# Patient Record
Sex: Male | Born: 1945 | Race: White | Hispanic: No | Marital: Single | Smoking: Former smoker
Health system: Southern US, Community
[De-identification: ages and names within clinical notes are randomized; demographics above are authoritative.]

## PROBLEM LIST (undated history)

## (undated) DIAGNOSIS — E7800 Pure hypercholesterolemia, unspecified: Secondary | ICD-10-CM

## (undated) DIAGNOSIS — M1990 Unspecified osteoarthritis, unspecified site: Secondary | ICD-10-CM

## (undated) DIAGNOSIS — E876 Hypokalemia: Secondary | ICD-10-CM

## (undated) DIAGNOSIS — Z8619 Personal history of other infectious and parasitic diseases: Secondary | ICD-10-CM

## (undated) DIAGNOSIS — E538 Deficiency of other specified B group vitamins: Secondary | ICD-10-CM

## (undated) DIAGNOSIS — I10 Essential (primary) hypertension: Secondary | ICD-10-CM

## (undated) DIAGNOSIS — Z972 Presence of dental prosthetic device (complete) (partial): Secondary | ICD-10-CM

## (undated) DIAGNOSIS — D696 Thrombocytopenia, unspecified: Secondary | ICD-10-CM

## (undated) DIAGNOSIS — J45909 Unspecified asthma, uncomplicated: Secondary | ICD-10-CM

## (undated) DIAGNOSIS — E785 Hyperlipidemia, unspecified: Secondary | ICD-10-CM

## (undated) DIAGNOSIS — M5136 Other intervertebral disc degeneration, lumbar region: Secondary | ICD-10-CM

## (undated) DIAGNOSIS — I509 Heart failure, unspecified: Secondary | ICD-10-CM

## (undated) DIAGNOSIS — K219 Gastro-esophageal reflux disease without esophagitis: Secondary | ICD-10-CM

## (undated) DIAGNOSIS — E119 Type 2 diabetes mellitus without complications: Secondary | ICD-10-CM

## (undated) DIAGNOSIS — E269 Hyperaldosteronism, unspecified: Secondary | ICD-10-CM

## (undated) DIAGNOSIS — IMO0002 Reserved for concepts with insufficient information to code with codable children: Secondary | ICD-10-CM

## (undated) DIAGNOSIS — I491 Atrial premature depolarization: Secondary | ICD-10-CM

## (undated) DIAGNOSIS — J449 Chronic obstructive pulmonary disease, unspecified: Secondary | ICD-10-CM

## (undated) DIAGNOSIS — N189 Chronic kidney disease, unspecified: Secondary | ICD-10-CM

## (undated) DIAGNOSIS — R0600 Dyspnea, unspecified: Secondary | ICD-10-CM

## (undated) DIAGNOSIS — R918 Other nonspecific abnormal finding of lung field: Secondary | ICD-10-CM

## (undated) DIAGNOSIS — R569 Unspecified convulsions: Secondary | ICD-10-CM

## (undated) DIAGNOSIS — M51369 Other intervertebral disc degeneration, lumbar region without mention of lumbar back pain or lower extremity pain: Secondary | ICD-10-CM

## (undated) DIAGNOSIS — M5416 Radiculopathy, lumbar region: Secondary | ICD-10-CM

## (undated) HISTORY — PX: BRAIN TUMOR EXCISION: SHX577

## (undated) HISTORY — PX: OTHER SURGICAL HISTORY: SHX169

## (undated) HISTORY — PX: THORACOTOMY: SUR1349

---

## 2004-01-17 ENCOUNTER — Other Ambulatory Visit: Payer: Self-pay

## 2004-01-17 ENCOUNTER — Emergency Department: Payer: Self-pay | Admitting: Emergency Medicine

## 2004-01-21 ENCOUNTER — Inpatient Hospital Stay: Payer: Self-pay | Admitting: Internal Medicine

## 2004-01-31 ENCOUNTER — Ambulatory Visit: Payer: Self-pay | Admitting: Internal Medicine

## 2004-02-04 ENCOUNTER — Ambulatory Visit: Payer: Self-pay | Admitting: Internal Medicine

## 2004-02-11 ENCOUNTER — Ambulatory Visit: Payer: Self-pay | Admitting: Internal Medicine

## 2004-02-26 ENCOUNTER — Ambulatory Visit: Payer: Self-pay | Admitting: Internal Medicine

## 2005-05-24 ENCOUNTER — Ambulatory Visit: Payer: Self-pay | Admitting: Internal Medicine

## 2005-11-12 ENCOUNTER — Ambulatory Visit: Payer: Self-pay | Admitting: Gastroenterology

## 2006-07-29 IMAGING — CT CT CHEST W/ CM
1 of 2 series · 15 of 32 positions shown, 19 images · IV contrast (APPLIED)
Comparison: none

REASON FOR EXAM: diff breathing/pain rm14
COMMENTS:

[Series 6: inspace · axial · 0.73mm/px · z∈[-382,-60]mm · 15 of 507 slices shown, 19 images]
[im 24/507  mediastinal]
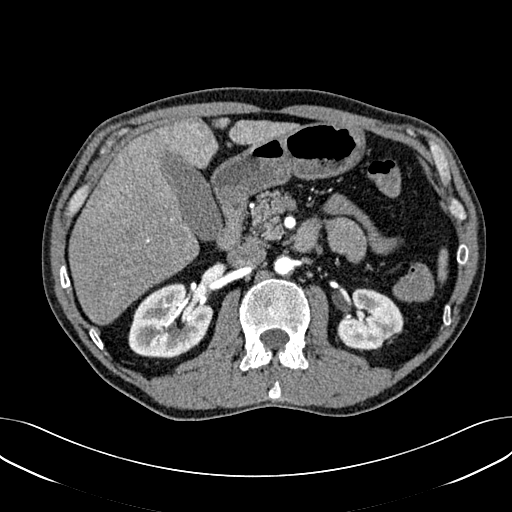
[im 24/507  lung]
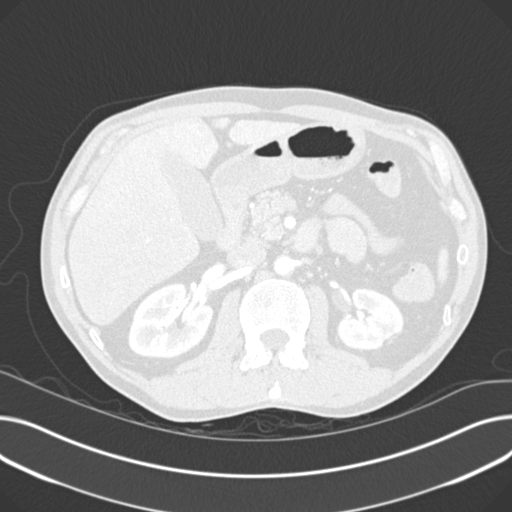
[im 70/507  lung]
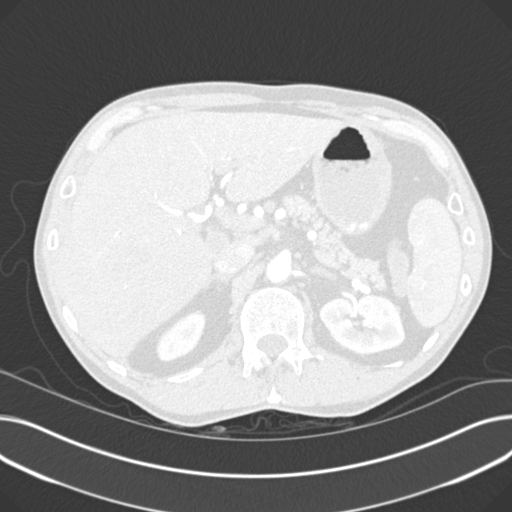
[im 116/507  lung]
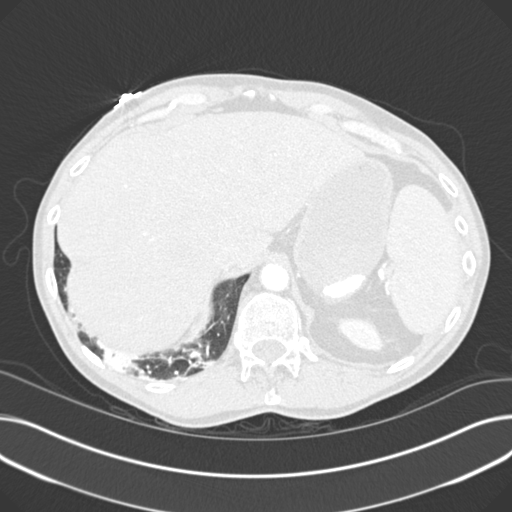
[im 127/507  lung]
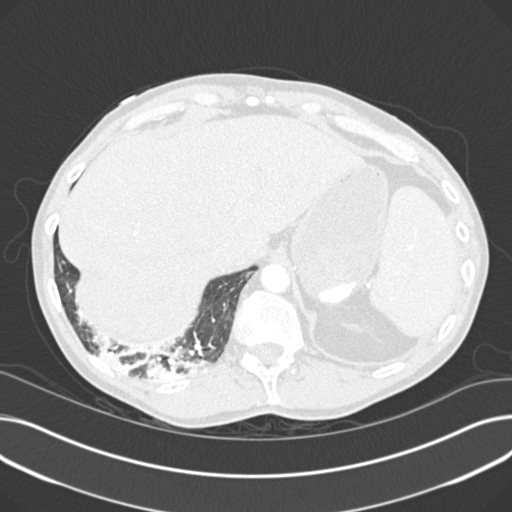
[im 162/507  mediastinal]
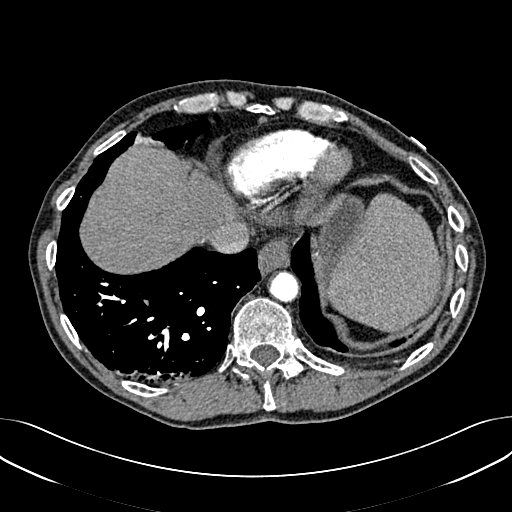
[im 162/507  lung]
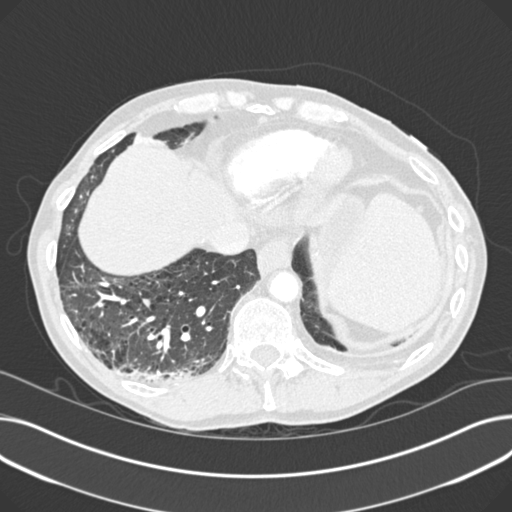
[im 185/507  lung]
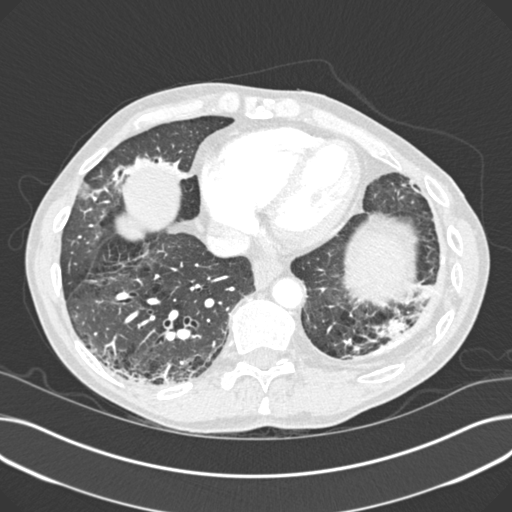
[im 231/507  lung]
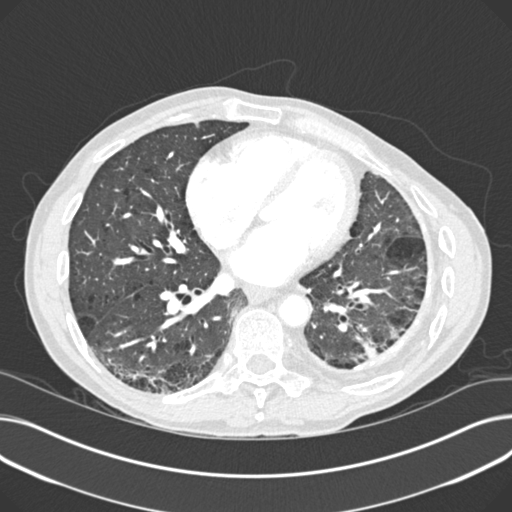
[im 254/507  lung]
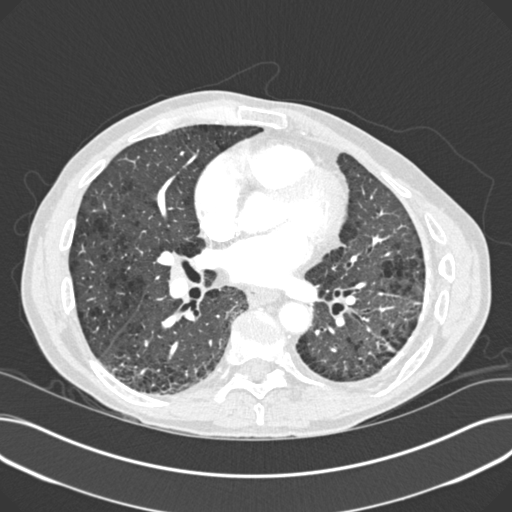
[im 277/507  mediastinal]
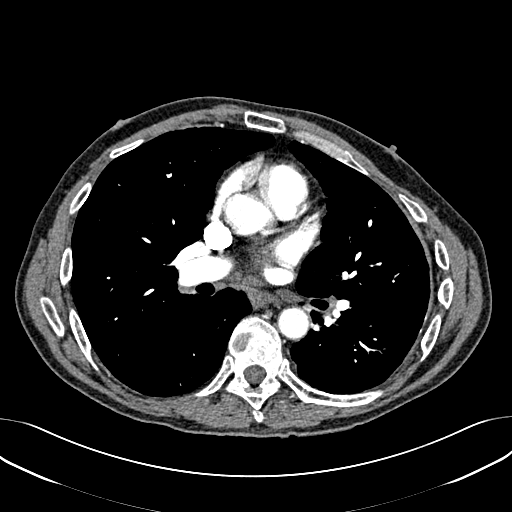
[im 277/507  lung]
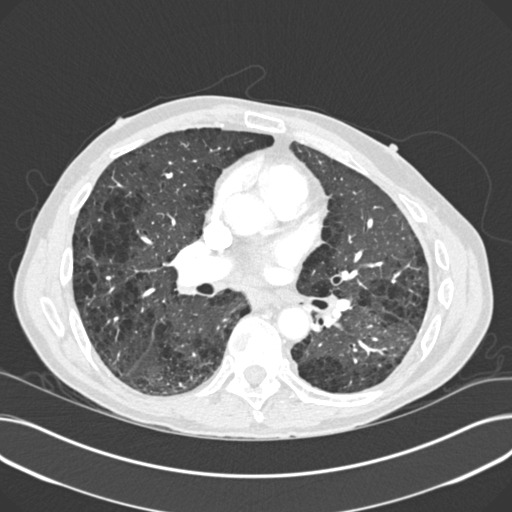
[im 323/507  lung]
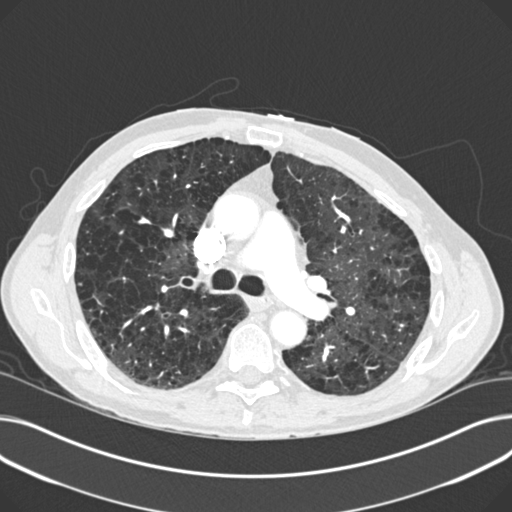
[im 346/507  lung]
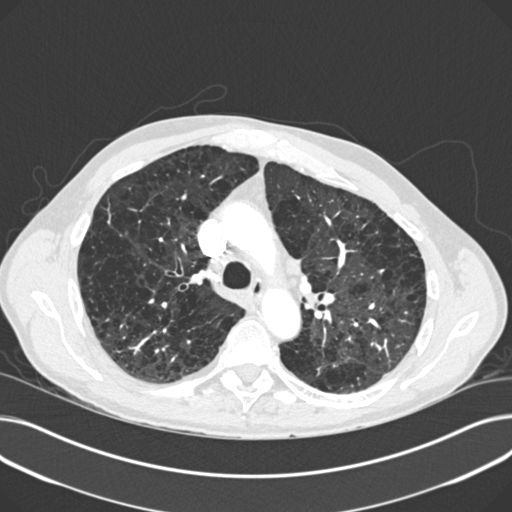
[im 380/507  lung]
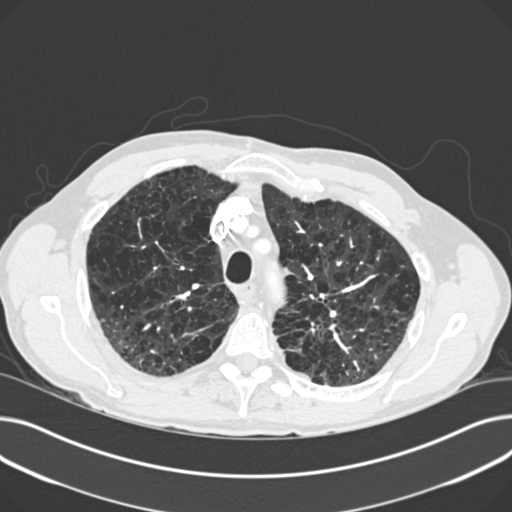
[im 415/507  mediastinal]
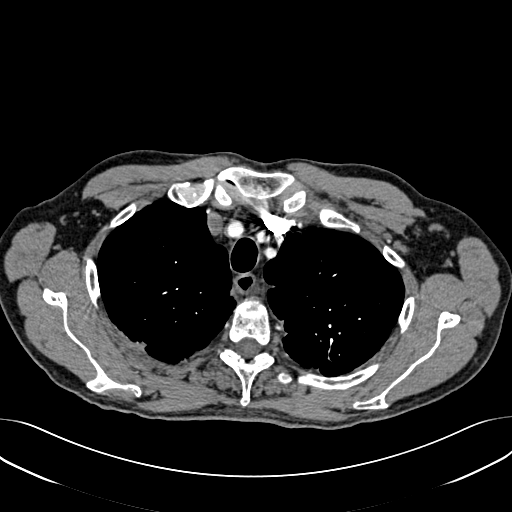
[im 415/507  lung]
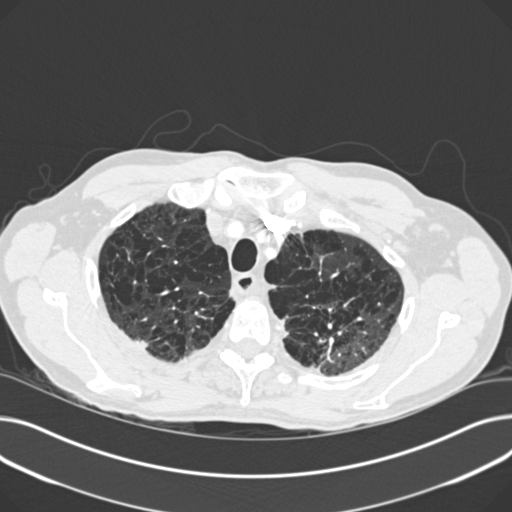
[im 438/507  lung]
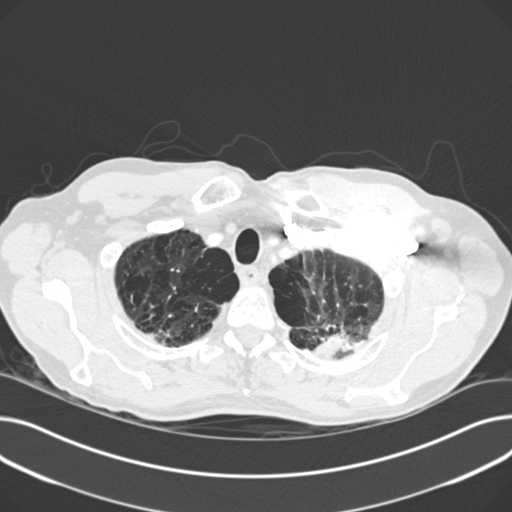
[im 484/507  lung]
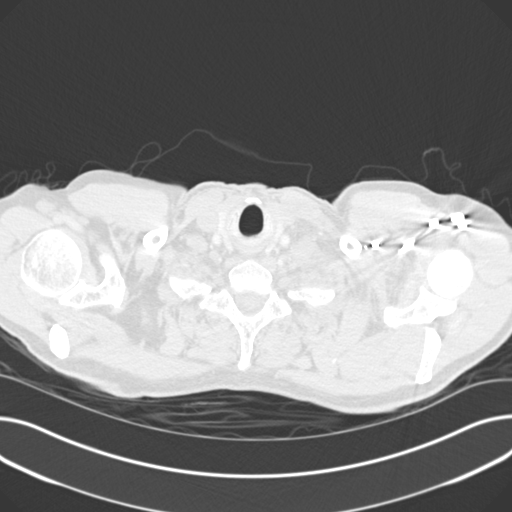

[15 of 32 positions shown; findings below may reference images not displayed]

PROCEDURE:     CT  - CT CHEST (FOR PE) W  - January 17, 2004  [DATE]

RESULT:     This study was initially interpreted by Dr. Palape of [REDACTED].  Preliminary report was relayed to Dr. Heller of the
[HOSPITAL] [DATE] p.m. on 01/17/04.

Spiral 3 mm sections were obtained from the thoracic inlet through the lung
bases status post intravenous administration of 100 cc of Isovue 370.

Evaluation of the mediastinum and hilar regions and structures demonstrates
small subcentimeter lymph nodes within the posterior prevascular space.  No
evidence of pathologic size adenopathy or masses within the mediastinum are
demonstrated.

There is no evidence of filling defect within the main lobar or segmental
pulmonary arteries to suggest sequela of pulmonary embolus.

A soft tissue density projects within the posterior LEFT lung apex along the
pleural surface.  This appears to contain a focal region of calcium.
Calcified pleural plaques are also demonstrated within the LEFT lung base.
There is an area of atelectasis vs. infiltrate or scarring along the
posterior aspect of the RIGHT lung base.  The lung parenchyma demonstrates
diffuse severe emphysematous changes.  The visualized upper abdominal
viscera demonstrate no gross abnormalities.
IMPRESSION: 1.     No evidence of pulmonary embolus.
2.     Severe emphysematous changes.
3.     Soft tissue density within the LEFT lung apex.  This may represent a
region of chronic scarring though an area of neoplastic disease cannot be
excluded without documentation of stability.  Follow up evaluation is
recommended and/or further evaluation with tissue sample if clinically
warranted.

## 2007-08-09 ENCOUNTER — Ambulatory Visit: Payer: Self-pay | Admitting: Internal Medicine

## 2011-03-27 ENCOUNTER — Emergency Department: Payer: Self-pay | Admitting: Emergency Medicine

## 2011-03-27 LAB — CBC WITH DIFFERENTIAL/PLATELET
Basophil #: 0 10*3/uL (ref 0.0–0.1)
Basophil %: 0.2 %
Eosinophil #: 0.2 10*3/uL (ref 0.0–0.7)
HCT: 48.6 % (ref 40.0–52.0)
HGB: 16.4 g/dL (ref 13.0–18.0)
Lymphocyte %: 14.3 %
MCH: 29.9 pg (ref 26.0–34.0)
MCV: 88 fL (ref 80–100)
Monocyte %: 10.4 %
Neutrophil #: 9.7 10*3/uL — ABNORMAL HIGH (ref 1.4–6.5)
Neutrophil %: 73.3 %
RBC: 5.49 10*6/uL (ref 4.40–5.90)
WBC: 13.2 10*3/uL — ABNORMAL HIGH (ref 3.8–10.6)

## 2011-03-27 LAB — COMPREHENSIVE METABOLIC PANEL
Albumin: 4.1 g/dL (ref 3.4–5.0)
Alkaline Phosphatase: 88 U/L (ref 50–136)
Anion Gap: 14 (ref 7–16)
Calcium, Total: 9.5 mg/dL (ref 8.5–10.1)
Co2: 31 mmol/L (ref 21–32)
EGFR (Non-African Amer.): >60
Glucose: 123 mg/dL — ABNORMAL HIGH (ref 65–99)
Potassium: 2.8 mmol/L — ABNORMAL LOW (ref 3.5–5.1)
SGOT(AST): 21 U/L (ref 15–37)
SGPT (ALT): 19 U/L
Total Protein: 8.8 g/dL — ABNORMAL HIGH (ref 6.4–8.2)

## 2011-03-27 LAB — TROPONIN I: Troponin-I: 0.04 ng/mL

## 2011-03-30 ENCOUNTER — Ambulatory Visit: Payer: Self-pay | Admitting: Oncology

## 2011-04-01 ENCOUNTER — Ambulatory Visit: Payer: Self-pay | Admitting: Oncology

## 2011-04-26 ENCOUNTER — Ambulatory Visit: Payer: Self-pay | Admitting: Oncology

## 2011-07-22 ENCOUNTER — Ambulatory Visit: Payer: Self-pay | Admitting: Cardiothoracic Surgery

## 2011-07-22 ENCOUNTER — Ambulatory Visit: Payer: Self-pay | Admitting: Oncology

## 2011-07-26 ENCOUNTER — Ambulatory Visit: Payer: Self-pay | Admitting: Oncology

## 2011-08-04 LAB — CBC CANCER CENTER
Eosinophil #: 0.3 x10 3/mm (ref 0.0–0.7)
Eosinophil %: 4.6 %
HCT: 46.5 % (ref 40.0–52.0)
HGB: 15.8 g/dL (ref 13.0–18.0)
Lymphocyte #: 2.3 x10 3/mm (ref 1.0–3.6)
Lymphocyte %: 39.4 %
MCH: 29.5 pg (ref 26.0–34.0)
MCHC: 34.1 g/dL (ref 32.0–36.0)
Monocyte #: 0.7 x10 3/mm (ref 0.2–1.0)
RBC: 5.37 10*6/uL (ref 4.40–5.90)

## 2011-08-04 LAB — CREATININE, SERUM: Creatinine: 1.38 mg/dL — ABNORMAL HIGH (ref 0.60–1.30)

## 2011-08-04 LAB — PROTIME-INR: INR: 0.9

## 2011-08-05 ENCOUNTER — Inpatient Hospital Stay: Payer: Self-pay

## 2011-08-06 LAB — CBC WITH DIFFERENTIAL/PLATELET
Basophil #: 0 10*3/uL (ref 0.0–0.1)
Eosinophil %: 0 %
HCT: 39.8 % — ABNORMAL LOW (ref 40.0–52.0)
HGB: 13.4 g/dL (ref 13.0–18.0)
Lymphocyte #: 0.8 10*3/uL — ABNORMAL LOW (ref 1.0–3.6)
MCH: 29.3 pg (ref 26.0–34.0)
MCHC: 33.7 g/dL (ref 32.0–36.0)
MCV: 87 fL (ref 80–100)
Monocyte #: 0.9 x10 3/mm (ref 0.2–1.0)
Neutrophil %: 88.1 %
Platelet: 135 10*3/uL — ABNORMAL LOW (ref 150–440)
RDW: 14.4 % (ref 11.5–14.5)

## 2011-08-06 LAB — BASIC METABOLIC PANEL
Anion Gap: 13 (ref 7–16)
BUN: 14 mg/dL (ref 7–18)
Chloride: 99 mmol/L (ref 98–107)
Co2: 28 mmol/L (ref 21–32)
Creatinine: 1.85 mg/dL — ABNORMAL HIGH (ref 0.60–1.30)
EGFR (Non-African Amer.): 37 — ABNORMAL LOW
Glucose: 203 mg/dL — ABNORMAL HIGH (ref 65–99)
Osmolality: 286 (ref 275–301)
Potassium: 2.7 mmol/L — ABNORMAL LOW (ref 3.5–5.1)

## 2011-08-10 LAB — PATHOLOGY REPORT

## 2011-08-26 ENCOUNTER — Ambulatory Visit: Payer: Self-pay | Admitting: Oncology

## 2011-09-21 LAB — BASIC METABOLIC PANEL
Anion Gap: 8 (ref 7–16)
BUN: 8 mg/dL (ref 7–18)
Chloride: 101 mmol/L (ref 98–107)
Co2: 31 mmol/L (ref 21–32)
Creatinine: 1.28 mg/dL (ref 0.60–1.30)
EGFR (Non-African Amer.): 58 — ABNORMAL LOW
Potassium: 3.2 mmol/L — ABNORMAL LOW (ref 3.5–5.1)
Sodium: 140 mmol/L (ref 136–145)

## 2011-09-26 ENCOUNTER — Ambulatory Visit: Payer: Self-pay | Admitting: Oncology

## 2011-10-26 ENCOUNTER — Ambulatory Visit: Payer: Self-pay | Admitting: Oncology

## 2011-11-26 ENCOUNTER — Ambulatory Visit: Payer: Self-pay | Admitting: Oncology

## 2012-05-02 ENCOUNTER — Ambulatory Visit: Payer: Self-pay | Admitting: Oncology

## 2012-05-04 ENCOUNTER — Ambulatory Visit: Payer: Self-pay | Admitting: Oncology

## 2012-05-25 ENCOUNTER — Ambulatory Visit: Payer: Self-pay | Admitting: Oncology

## 2013-03-19 ENCOUNTER — Ambulatory Visit: Payer: Self-pay | Admitting: Physical Medicine and Rehabilitation

## 2014-05-14 NOTE — Discharge Summary (Signed)
PATIENT NAME:  Glenn Welch, Glenn Welch MR#:  [id number] DATE OF BIRTH:  30-Nov-1945  DATE OF ADMISSION:  08/05/2011 DATE OF DISCHARGE:  08/12/2011  ADMITTING DIAGNOSIS: Iatrogenic right pneumothorax.   DISCHARGE DIAGNOSIS: Iatrogenic right pneumothorax.   OPERATION PERFORMED: Right thoracoscopy with retrieval of intrathoracic foreign body.   HOSPITAL COURSE: Mr. Joseth Weigel is a 69 year old gentleman with a known right upper lobe mass. He had been followed by serial CT scans and there was concern that this may represent a malignancy. He was scheduled for an outpatient CT-guided needle biopsy and during the procedure an iatrogenic pneumothorax occurred resulting in the need for chest tube insertion. During the procedure a metal cannula became dislodged in the patient's chest and he had to be taken to the Emergency Room where he underwent a right thoracoscopy for retrieval of this intrathoracic foreign body. The next day the patient was taken back to the CT suite where he underwent additional core biopsies of the right upper lobe mass. The final pathology on these was negative for malignancy. The patient had a postoperative air leak which was managed conservatively over the next few days and ultimately on 07/18 the chest tube was removed and a post chest tube removal chest x-ray showed no evidence of a pneumothorax. The patient was discharged to home. During the hospital course he did have difficulty with urination and required a Foley catheter for inability to void. He also required oxygen therapy intermittently but at the time of discharge his oxygen saturations were greater than 92% while ambulatory. He will be scheduled follow up with Dr. Genevive Bi in one week. He will have a chest x-ray made at that time.   DISCHARGE MEDICATIONS:  1. Percocet 5/325, 1 to 2 tablets as needed every 4 to 6 hours for pain.  2. Omeprazole 20 mg once a day. 3. Metoprolol 25 mg once a day. 4. Hydrochlorothiazide 25 mg once a day.   5. Potassium chloride 20 mEq 2 tablets a day. 6. Zocor 40 mg once a day. 7. Lisinopril 40 mg once a day. 8. Gaviscon as needed.   DISCHARGE INSTRUCTIONS: He was told to leave his dressings in place for 48 hours after which time he may remove them and shower.   ____________________________ Lew Dawes Genevive Bi, MD teo:cms D: 08/12/2011 11:34:24 ET T: 08/12/2011 15:03:03 ET JOB#: [id number]  cc: Christia Reading E. Genevive Bi, MD, <Dictator> Louis Matte MD ELECTRONICALLY SIGNED 08/18/2011 11:00

## 2014-05-19 NOTE — H&P (Signed)
PATIENT NAME:  Glenn Welch, Glenn Welch MR#:  [id number] DATE OF BIRTH:  13-Oct-1945  DATE OF ADMISSION:  08/05/2011  ADMITTING PHYSICIAN: Nestor Lewandowsky, MD  ADMITTING DIAGNOSIS: Retained foreign body, right chest.   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 Several years ago he underwent thoracotomy and resection and more recently he was identified as having a right upper lobe nodule. A CT scan was performed recently which showed a slight increase in the size of the right upper lobe nodule and he was offered a CT-guided needle biopsy as part of the work-up. This was performed today and unfortunately during the procedure the needle broke off in the chest cavity and the patient was taken to the operating room where he underwent a bronchoscopy and thoracoscopy with removal of the needle. There were no other untoward complications, there were no other injuries to the chest that we could identify, and the patient was then admitted to the hospital after the procedure for continued care.   PAST MEDICAL HISTORY:  1. Benign brain tumor removed approximately 15 years ago.  2. In 2008 he had a left thoracotomy and lung resection. At the time, it was not clear what this was for, but it may have been for a benign tumor. In addition, he underwent talc pleurodesis on that side.   SOCIAL HISTORY: He is divorced. He smoked for many years, but quit in 2005. He has a previous history of heavy alcohol consumption but none within the last several years.   FAMILY HISTORY: His mother is deceased from lung and breast cancer. His father is deceased from an aneurysm. He also has a history of diabetes in a brother.   REVIEW OF SYSTEMS: Unobtainable at this time as the patient is immediately postoperative.       PHYSICAL EXAMINATION:   GENERAL/VITALS: Examination revealed a thin, pleasant gentleman who had oxygen saturations of approximately 90%  on nasal cannula. His blood pressure was 120/80 with a heart rate of approximately 60.   NECK: Supple without thyromegaly or adenopathy.   RESPIRATORY: Lungs were actually clear bilaterally. They were somewhat diminished on the left. His surgical wounds were all intact.   CARDIOVASCULAR: Heart was regular.   ABDOMEN: Soft and nontender.   EXTREMITIES: No clubbing, cyanosis, or edema.   ASSESSMENT AND PLAN: This patient is status post right thoracoscopy with removal of a foreign body. He will need to be admitted to the hospital for continued maintenance. We will continue his Kefzol for several more doses. We will keep his chest tube to suction overnight and repeat his chest x-ray tomorrow morning. Assuming there are no other problems with his chest tube, it is likely that these can be removed in the very near future.  ____________________________ Lew Dawes Genevive Bi, MD teo:slb D: 08/05/2011 17:08:35 ET T: 08/05/2011 17:17:57 ET JOB#: [id number]  cc: Christia Reading E. Genevive Bi, MD, <Dictator> Louis Matte MD ELECTRONICALLY SIGNED 08/12/2011 8:33

## 2014-05-19 NOTE — Op Note (Signed)
PATIENT NAME:  Glenn Welch, Glenn Welch MR#:  [id number] DATE OF BIRTH:  11-18-1945  DATE OF PROCEDURE:  08/05/2011  PREOPERATIVE DIAGNOSIS: Foreign body, right chest.   POSTOPERATIVE DIAGNOSIS: Foreign body, right chest.  PROCEDURES PERFORMED:  1. Preoperative bronchoscopy to assess endobronchial anatomy.  2. Right thoracoscopy with retrieval of foreign body.  SURGEON: Edvardo Honse E. Genevive Bi, M.D.   ASSISTANT: None.   INDICATIONS FOR PROCEDURE: Glenn Welch is a 69 year old gentleman who was undergoing a CT-guided needle biopsy today. During the procedure the needle which was used to gain access to the pleural space broke off into the patient's chest wall. A portion of the needle was in the chest and a portion was outside the chest wall. After attempts were made to retrieve the needle in the fluoroscopy department, this was unsuccessful and the patient was therefore offered the above-named procedure. Because the patient had intravenous sedation on board consent was obtained from the daughter, Glenn Welch, who was apprised of the indications and risks and gave consent.   DESCRIPTION OF PROCEDURE: The patient was brought to the operating suite and placed in the supine position. General endotracheal anesthesia was given. Preoperative bronchoscopy was carried out. There was no evidence of endobronchial tumor. On the left side, there were both upper and lower orifices. I did not appreciate any cancer, tumors, bleeding, or nodules in any of the segments. The patient was then rolled for a right thoracoscopy. All pressure points were carefully padded. The patient was prepped and draped in the usual sterile fashion. A small skin incision was made at approximately the seventh intercostal space and carried down through the muscles of the chest wall until the pleural space was entered. Upon entering the pleural space we placed a thoracoscope. The right lung was deflated. Looking posteriorly and inferiorly we could see the cannula.  This was on the outside of the lower lobe. There were no other findings in the pleural space with the exception of the foreign body. We retrieved this by making a second smaller incision anteriorly and superiorly in approximately the fifth interspace. A small clamp was placed through it and the needle was grasped. It was then removed. No further foreign bodies were identified. Examination of this foreign body revealed that it appeared to be the entire needle. [name] who was present during the initial biopsy confirmed at this did appear to be the entire needle. At this point, we then carefully examined the lung for any other injury. There was none identified. There was no bleeding. We did see severe bullous emphysematous changes. In addition there are multiple cystic areas in the lungs throughout all lobes.  The chest was irrigated and a single 28 French chest tube was positioned to the apex. The chest wall muscles were then closed over the chest tube with 0 Vicryl, subcutaneous tissues with 2-0 Vicryl and the skin with nylon. The other thoracoscopy port was closed with a single subcutaneous stitch of 2-0 Vicryl and several sutures of nylon. The patient was then rolled in the supine position after sterile dressings were applied and the anterior chest tube which had been placed by Dr. Register was then removed without difficulty. This area was covered with Vaseline gauze and sterile dressings. The patient was awakened from general endotracheal anesthesia and then taken to the recovery room in stable condition.  ____________________________ Lew Dawes Genevive Bi, MD teo:slb D: 08/05/2011 17:03:31 ET T: 08/05/2011 17:32:38 ET JOB#: [id number]  cc: Christia Reading E. Genevive Bi, MD, <Dictator> Louis Matte MD ELECTRONICALLY  SIGNED 08/12/2011 8:35

## 2014-06-27 IMAGING — CR DG CHEST 1V PORT
1 series · 1 of 1 positions shown · non-contrast
Comparison: none

REASON FOR EXAM: assess for plueral effusion
COMMENTS:

PROCEDURE:     DXR - DXR PORTABLE CHEST SINGLE VIEW  - August 05, 2011  [DATE]
RESULT:     Comparison: 03/27/2011

[portable]
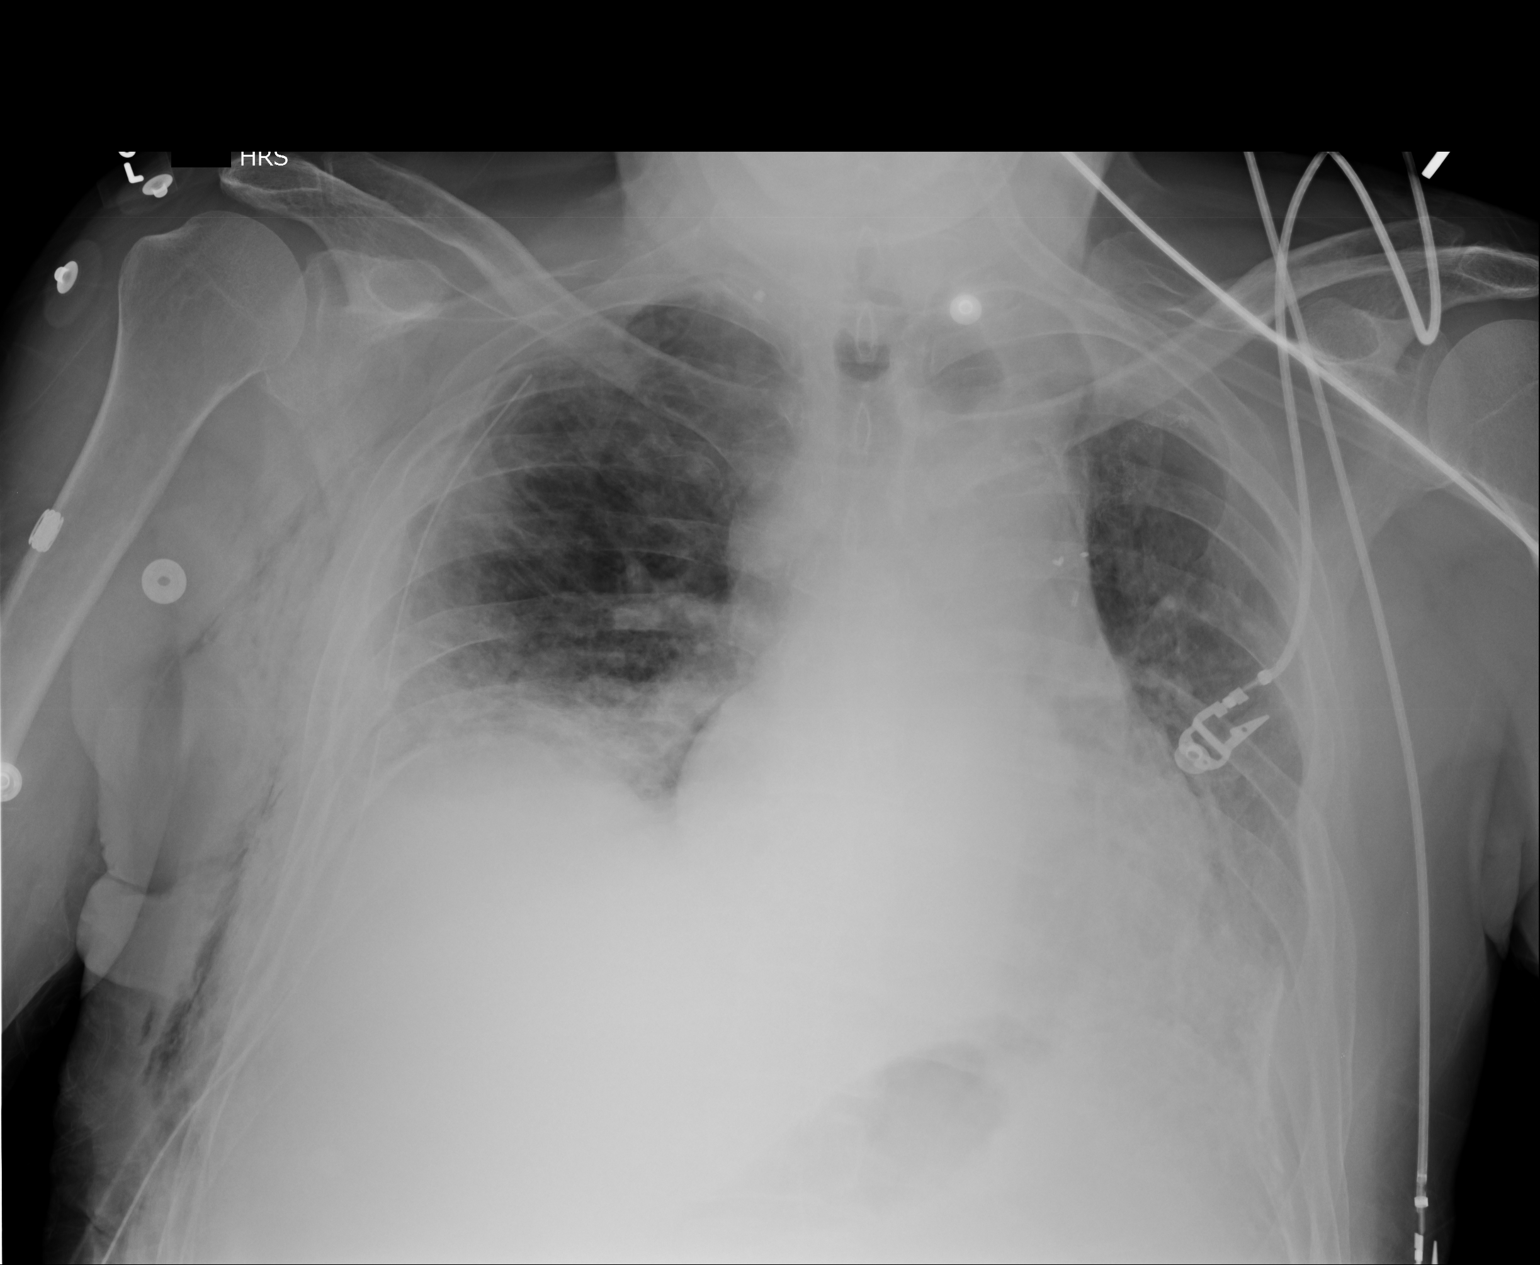

[1 of 1 positions shown; findings below may reference images not displayed]

FINDINGS: Heart and mediastinum are stable. The lung volumes are low. A right-sided
chest tube is in place. There may be a trace residual pneumothorax.
Heterogeneous opacities in the right lung are likely secondary to
atelectasis. Opacities in the periphery of the left lower lung are similar
to prior related to chronic changes. There is mild subcutaneous emphysema in
the right lateral chest wall.
IMPRESSION: Trace right apical pneumothorax. Right-sided chest tube is in place.

## 2015-11-18 ENCOUNTER — Encounter: Payer: Self-pay | Admitting: *Deleted

## 2015-11-19 ENCOUNTER — Ambulatory Visit
Admission: RE | Admit: 2015-11-19 | Discharge: 2015-11-19 | Disposition: A | Discharge status: Home / Self Care | Payer: Medicare Other | Source: Ambulatory Visit | Attending: Unknown Physician Specialty | Admitting: Unknown Physician Specialty

## 2015-11-19 ENCOUNTER — Encounter
Admission: RE | Disposition: A | Discharge status: Home / Self Care | Payer: Self-pay | Source: Ambulatory Visit | Attending: Unknown Physician Specialty

## 2015-11-19 ENCOUNTER — Encounter: Payer: Self-pay | Admitting: *Deleted

## 2015-11-19 ENCOUNTER — Ambulatory Visit: Payer: Medicare Other | Admitting: Anesthesiology

## 2015-11-19 DIAGNOSIS — K621 Rectal polyp: Secondary | ICD-10-CM | POA: Diagnosis not present

## 2015-11-19 DIAGNOSIS — E1122 Type 2 diabetes mellitus with diabetic chronic kidney disease: Secondary | ICD-10-CM | POA: Insufficient documentation

## 2015-11-19 DIAGNOSIS — E7800 Pure hypercholesterolemia, unspecified: Secondary | ICD-10-CM | POA: Insufficient documentation

## 2015-11-19 DIAGNOSIS — M5416 Radiculopathy, lumbar region: Secondary | ICD-10-CM | POA: Insufficient documentation

## 2015-11-19 DIAGNOSIS — Z86010 Personal history of colonic polyps: Secondary | ICD-10-CM | POA: Diagnosis not present

## 2015-11-19 DIAGNOSIS — I129 Hypertensive chronic kidney disease with stage 1 through stage 4 chronic kidney disease, or unspecified chronic kidney disease: Secondary | ICD-10-CM | POA: Insufficient documentation

## 2015-11-19 DIAGNOSIS — Z79899 Other long term (current) drug therapy: Secondary | ICD-10-CM | POA: Diagnosis not present

## 2015-11-19 DIAGNOSIS — M1990 Unspecified osteoarthritis, unspecified site: Secondary | ICD-10-CM | POA: Insufficient documentation

## 2015-11-19 DIAGNOSIS — R0600 Dyspnea, unspecified: Secondary | ICD-10-CM | POA: Diagnosis not present

## 2015-11-19 DIAGNOSIS — K640 First degree hemorrhoids: Secondary | ICD-10-CM | POA: Diagnosis not present

## 2015-11-19 DIAGNOSIS — Z1211 Encounter for screening for malignant neoplasm of colon: Secondary | ICD-10-CM | POA: Diagnosis not present

## 2015-11-19 DIAGNOSIS — J449 Chronic obstructive pulmonary disease, unspecified: Secondary | ICD-10-CM | POA: Diagnosis not present

## 2015-11-19 DIAGNOSIS — D123 Benign neoplasm of transverse colon: Secondary | ICD-10-CM | POA: Diagnosis not present

## 2015-11-19 DIAGNOSIS — D127 Benign neoplasm of rectosigmoid junction: Secondary | ICD-10-CM | POA: Diagnosis not present

## 2015-11-19 DIAGNOSIS — Z87891 Personal history of nicotine dependence: Secondary | ICD-10-CM | POA: Diagnosis not present

## 2015-11-19 DIAGNOSIS — E876 Hypokalemia: Secondary | ICD-10-CM | POA: Diagnosis not present

## 2015-11-19 DIAGNOSIS — N189 Chronic kidney disease, unspecified: Secondary | ICD-10-CM | POA: Insufficient documentation

## 2015-11-19 HISTORY — DX: Reserved for concepts with insufficient information to code with codable children: IMO0002

## 2015-11-19 HISTORY — DX: Type 2 diabetes mellitus without complications: E11.9

## 2015-11-19 HISTORY — DX: Dyspnea, unspecified: R06.00

## 2015-11-19 HISTORY — DX: Hypokalemia: E87.6

## 2015-11-19 HISTORY — PX: COLONOSCOPY WITH PROPOFOL: SHX5780

## 2015-11-19 HISTORY — DX: Chronic obstructive pulmonary disease, unspecified: J44.9

## 2015-11-19 HISTORY — DX: Chronic kidney disease, unspecified: N18.9

## 2015-11-19 HISTORY — DX: Unspecified osteoarthritis, unspecified site: M19.90

## 2015-11-19 HISTORY — DX: Radiculopathy, lumbar region: M54.16

## 2015-11-19 HISTORY — DX: Essential (primary) hypertension: I10

## 2015-11-19 HISTORY — DX: Pure hypercholesterolemia, unspecified: E78.00

## 2015-11-19 SURGERY — COLONOSCOPY WITH PROPOFOL
Anesthesia: General

## 2015-11-19 MED ORDER — LIDOCAINE 2% (20 MG/ML) 5 ML SYRINGE
INTRAMUSCULAR | Status: DC | PRN
  Administered 2015-11-19: 40 mg via INTRAVENOUS

## 2015-11-19 MED ORDER — FENTANYL CITRATE (PF) 100 MCG/2ML IJ SOLN
INTRAMUSCULAR | Status: DC | PRN
  Administered 2015-11-19: 50 ug via INTRAVENOUS

## 2015-11-19 MED ORDER — SODIUM CHLORIDE 0.9 % IV SOLN: INTRAVENOUS | Status: DC

## 2015-11-19 MED ORDER — SODIUM CHLORIDE 0.9 % IV SOLN
INTRAVENOUS | Status: DC
  Administered 2015-11-19: 08:00:00 via INTRAVENOUS
  Administered 2015-11-19: 1000 mL via INTRAVENOUS

## 2015-11-19 MED ORDER — PHENYLEPHRINE HCL 10 MG/ML IJ SOLN
INTRAMUSCULAR | Status: DC | PRN
  Administered 2015-11-19 (×3): 100 ug via INTRAVENOUS

## 2015-11-19 MED ORDER — PROPOFOL 10 MG/ML IV BOLUS
INTRAVENOUS | Status: DC | PRN
  Administered 2015-11-19: 100 mg via INTRAVENOUS

## 2015-11-19 MED ORDER — MIDAZOLAM HCL 5 MG/5ML IJ SOLN
INTRAMUSCULAR | Status: DC | PRN
  Administered 2015-11-19: 1 mg via INTRAVENOUS

## 2015-11-19 MED ORDER — PROPOFOL 500 MG/50ML IV EMUL
INTRAVENOUS | Status: DC | PRN
  Administered 2015-11-19: 150 ug/kg/min via INTRAVENOUS

## 2015-11-19 NOTE — Anesthesia Preprocedure Evaluation (Signed)
Anesthesia Evaluation  Patient identified by MRN, date of birth, ID band Patient awake    Reviewed: Allergy & Precautions, NPO status , Patient's Chart, lab work & pertinent test results  Airway Mallampati: II       Dental  (+) Upper Dentures, Lower Dentures   Pulmonary COPD,  COPD inhaler, former smoker,     + decreased breath sounds      Cardiovascular Exercise Tolerance: Good hypertension, Pt. on medications and Pt. on home beta blockers  Rhythm:Regular     Neuro/Psych    GI/Hepatic negative GI ROS, Neg liver ROS,   Endo/Other  diabetes, Type 2  Renal/GU      Musculoskeletal   Abdominal Normal abdominal exam  (+)   Peds negative pediatric ROS (+)  Hematology   Anesthesia Other Findings   Reproductive/Obstetrics                             Anesthesia Physical Anesthesia Plan  ASA: III  Anesthesia Plan: General   Post-op Pain Management:    Induction: Intravenous  Airway Management Planned: Natural Airway and Nasal Cannula  Additional Equipment:   Intra-op Plan:   Post-operative Plan:   Informed Consent: I have reviewed the patients History and Physical, chart, labs and discussed the procedure including the risks, benefits and alternatives for the proposed anesthesia with the patient or authorized representative who has indicated his/her understanding and acceptance.     Plan Discussed with: CRNA  Anesthesia Plan Comments:         Anesthesia Quick Evaluation

## 2015-11-19 NOTE — Op Note (Signed)
[facility] Gastroenterology Patient Name: Glenn Welch Procedure Date: 11/19/2015 8:11 AM MRN: [id number] Account #: [id number] Date of Birth: [birth year] Admit Type: Outpatient Age: 70 Room: [facility] ENDO ROOM 4 Gender: Male Note Status: Finalized Procedure:            Colonoscopy Indications:          Screening for colorectal malignant neoplasm, High risk                        colon cancer surveillance: Personal history of colonic                        polyps Providers:            Manya Silvas, MD Referring MD:         Ramonita Lab, MD (Referring MD) Medicines:            Propofol per Anesthesia Complications:        No immediate complications. Procedure:            Pre-Anesthesia Assessment:                       - After reviewing the risks and benefits, the patient                        was deemed in satisfactory condition to undergo the                        procedure.                       After obtaining informed consent, the colonoscope was                        passed under direct vision. Throughout the procedure,                        the patient's blood pressure, pulse, and oxygen                        saturations were monitored continuously. The                        Colonoscope was introduced through the anus and                        advanced to the the cecum, identified by appendiceal                        orifice and ileocecal valve. The colonoscopy was                        performed without difficulty. The patient tolerated the                        procedure well. The quality of the bowel preparation                        was good. Findings:      Five sessile polyps were found in the recto-sigmoid colon. The polyps       were small in size.  These polyps were removed with a hot snare.       Resection and retrieval were complete.      Two sessile polyps were found in the recto-sigmoid colon. The polyps       were diminutive in  size. These polyps were removed with a jumbo cold       forceps. Resection and retrieval were complete.      A small polyp was found in the transverse colon. The polyp was sessile.       The polyp was removed with a hot snare. Resection and retrieval were       complete.      A diminutive polyp was found in the rectum. The polyp was sessile. The       polyp was removed with a jumbo cold forceps. Resection and retrieval       were complete.      Internal hemorrhoids were found during endoscopy. The hemorrhoids were       small and Grade I (internal hemorrhoids that do not prolapse).      The exam was otherwise without abnormality. Impression:           - Five small polyps at the recto-sigmoid colon, removed                        with a hot snare. Resected and retrieved.                       - Two diminutive polyps at the recto-sigmoid colon,                        removed with a jumbo cold forceps. Resected and                        retrieved.                       - One small polyp in the transverse colon, removed with                        a hot snare. Resected and retrieved.                       - One diminutive polyp in the rectum, removed with a                        jumbo cold forceps. Resected and retrieved.                       - Internal hemorrhoids.                       - The examination was otherwise normal. Recommendation:       - Await pathology results. Manya Silvas, MD 11/19/2015 8:40:45 AM This report has been signed electronically. Number of Addenda: 0 Note Initiated On: 11/19/2015 8:11 AM Scope Withdrawal Time: 0 hours 19 minutes 15 seconds  Total Procedure Duration: 0 hours 21 minutes 52 seconds       [facility]

## 2015-11-19 NOTE — Transfer of Care (Signed)
Immediate Anesthesia Transfer of Care Note  Patient: Glenn Welch  Procedure(s) Performed: Procedure(s): COLONOSCOPY WITH PROPOFOL (N/A)  Patient Location: PACU and Endoscopy Unit  Anesthesia Type:General  Level of Consciousness: sedated  Airway & Oxygen Therapy: Patient Spontanous Breathing and Patient connected to nasal cannula oxygen  Post-op Assessment: Report given to RN and Post -op Vital signs reviewed and stable  Post vital signs: Reviewed and stable  Last Vitals:  Vitals:   11/19/15 0745  BP: 136/72  Pulse: 61  Resp: 20  Temp: 36.6 C    Last Pain:  Vitals:   11/19/15 0745  TempSrc: Tympanic         Complications: No apparent anesthesia complications

## 2015-11-19 NOTE — H&P (Signed)
Primary Care Physician:  Adin Hector, MD Primary Gastroenterologist:  Dr. Vira Agar  Pre-Procedure History & Physical: HPI:  Glenn Welch is a 70 y.o. male is here for an colonoscopy.   Past Medical History:  Diagnosis Date  . Arthritis   . Chronic kidney disease   . COPD (chronic obstructive pulmonary disease) ([place])   . Diabetes mellitus without complication ([place])   . Dyspnea   . HNP (herniated nucleus pulposus)   . Hypertension   . Hypokalemia   . Lumbar radiculitis   . Pure hypercholesterolemia     Past Surgical History:  Procedure Laterality Date  . BRAIN TUMOR EXCISION     benign  . upper lobe of left lung      Prior to Admission medications   Medication Sig Start Date End Date Taking? Authorizing Provider  albuterol (PROVENTIL HFA;VENTOLIN HFA) 108 (90 Base) MCG/ACT inhaler Inhale into the lungs every 4 (four) hours as needed for wheezing or shortness of breath.   Yes Historical Provider, MD  amLODipine (NORVASC) 10 MG tablet Take 10 mg by mouth daily.   Yes Historical Provider, MD  gabapentin (NEURONTIN) 300 MG capsule Take 300 mg by mouth at bedtime.   Yes Historical Provider, MD  lisinopril (PRINIVIL,ZESTRIL) 40 MG tablet Take 40 mg by mouth daily.   Yes Historical Provider, MD  lovastatin (MEVACOR) 40 MG tablet Take 40 mg by mouth at bedtime.   Yes Historical Provider, MD  metoprolol succinate (TOPROL-XL) 25 MG 24 hr tablet Take 25 mg by mouth daily.   Yes Historical Provider, MD  omeprazole (PRILOSEC) 20 MG capsule Take 20 mg by mouth daily.   Yes Historical Provider, MD  potassium chloride SA (K-DUR,KLOR-CON) 20 MEQ tablet Take 20 mEq by mouth 2 (two) times daily.   Yes Historical Provider, MD  spironolactone (ALDACTONE) 25 MG tablet Take 25 mg by mouth daily.   Yes Historical Provider, MD  tiotropium (SPIRIVA) 18 MCG inhalation capsule Place 18 mcg into inhaler and inhale daily.   Yes Historical Provider, MD    Allergies as of 11/04/2015  . (Not on  File)    History reviewed. No pertinent family history.  Social History   Social History  . Marital status: Single    Spouse name: N/A  . Number of children: N/A  . Years of education: N/A   Occupational History  . Not on file.   Social History Main Topics  . Smoking status: Former Research scientist (life sciences)  . Smokeless tobacco: Never Used  . Alcohol use Not on file  . Drug use: Unknown  . Sexual activity: Not on file   Other Topics Concern  . Not on file   Social History Narrative  . No narrative on file    Review of Systems: See HPI, otherwise negative ROS  Physical Exam: BP 136/72   Pulse 61   Temp 97.8 F (36.6 C) (Tympanic)   Resp 20   Ht 5\' 8"  (1.727 m)   Wt 77.1 kg (170 lb)   SpO2 96%   BMI 25.85 kg/m  General:   Alert,  pleasant and cooperative in NAD Head:  Normocephalic and atraumatic. Neck:  Supple; no masses or thyromegaly. Lungs:  Clear throughout to auscultation.    Heart:  Regular rate and rhythm. Abdomen:  Soft, nontender and nondistended. Normal bowel sounds, without guarding, and without rebound.   Neurologic:  Alert and  oriented x4;  grossly normal neurologically.  Impression/Plan: Glenn Welch is here for  an colonoscopy to be performed for [facility] colon polyps  Risks, benefits, limitations, and alternatives regarding  colonoscopy have been reviewed with the patient.  Questions have been answered.  All parties agreeable.   Gaylyn Cheers, MD  11/19/2015, 8:08 AM

## 2015-11-19 NOTE — Anesthesia Postprocedure Evaluation (Signed)
Anesthesia Post Note  Patient: Glenn Welch  Procedure(s) Performed: Procedure(s) (LRB): COLONOSCOPY WITH PROPOFOL (N/A)  Patient location during evaluation: PACU Anesthesia Type: General Level of consciousness: awake Pain management: pain level controlled Vital Signs Assessment: post-procedure vital signs reviewed and stable Respiratory status: spontaneous breathing Cardiovascular status: stable Anesthetic complications: no    Last Vitals:  Vitals:   11/19/15 0903 11/19/15 0910  BP: 121/74 129/76  Pulse: (!) 51   Resp: 17   Temp:      Last Pain:  Vitals:   11/19/15 0842  TempSrc: Tympanic                 VAN STAVEREN,Zamari Vea

## 2015-11-20 LAB — SURGICAL PATHOLOGY

## 2018-12-28 ENCOUNTER — Other Ambulatory Visit: Payer: Self-pay

## 2018-12-28 ENCOUNTER — Other Ambulatory Visit
Admission: RE | Admit: 2018-12-28 | Discharge: 2018-12-28 | Disposition: A | Discharge status: Home / Self Care | Payer: Medicare Other | Source: Ambulatory Visit | Attending: Internal Medicine | Admitting: Internal Medicine

## 2018-12-28 DIAGNOSIS — Z01812 Encounter for preprocedural laboratory examination: Secondary | ICD-10-CM | POA: Insufficient documentation

## 2018-12-28 DIAGNOSIS — Z20828 Contact with and (suspected) exposure to other viral communicable diseases: Secondary | ICD-10-CM | POA: Diagnosis not present

## 2018-12-29 ENCOUNTER — Encounter: Payer: Self-pay | Admitting: *Deleted

## 2018-12-29 LAB — SARS CORONAVIRUS 2 (TAT 6-24 HRS): SARS Coronavirus 2: NEGATIVE

## 2019-01-01 ENCOUNTER — Emergency Department
Admission: EM | Admit: 2019-01-01 | Discharge: 2019-01-01 | Disposition: A | Discharge status: Home / Self Care | Payer: Medicare Other | Source: Home / Self Care | Attending: Emergency Medicine | Admitting: Emergency Medicine

## 2019-01-01 ENCOUNTER — Encounter: Payer: Self-pay | Admitting: *Deleted

## 2019-01-01 ENCOUNTER — Other Ambulatory Visit: Payer: Self-pay

## 2019-01-01 ENCOUNTER — Emergency Department: Payer: Medicare Other

## 2019-01-01 ENCOUNTER — Encounter
Admission: RE | Disposition: A | Discharge status: Home / Self Care | Payer: Self-pay | Source: Home / Self Care | Attending: Internal Medicine

## 2019-01-01 ENCOUNTER — Encounter: Payer: Self-pay | Admitting: Medical Oncology

## 2019-01-01 ENCOUNTER — Ambulatory Visit: Payer: Medicare Other | Admitting: Anesthesiology

## 2019-01-01 ENCOUNTER — Ambulatory Visit
Admission: RE | Admit: 2019-01-01 | Discharge: 2019-01-01 | Disposition: A | Discharge status: Home / Self Care | Payer: Medicare Other | Attending: Internal Medicine | Admitting: Internal Medicine

## 2019-01-01 DIAGNOSIS — K2210 Ulcer of esophagus without bleeding: Secondary | ICD-10-CM | POA: Insufficient documentation

## 2019-01-01 DIAGNOSIS — E7800 Pure hypercholesterolemia, unspecified: Secondary | ICD-10-CM | POA: Diagnosis not present

## 2019-01-01 DIAGNOSIS — K449 Diaphragmatic hernia without obstruction or gangrene: Secondary | ICD-10-CM | POA: Insufficient documentation

## 2019-01-01 DIAGNOSIS — I5022 Chronic systolic (congestive) heart failure: Secondary | ICD-10-CM | POA: Insufficient documentation

## 2019-01-01 DIAGNOSIS — M5136 Other intervertebral disc degeneration, lumbar region: Secondary | ICD-10-CM | POA: Insufficient documentation

## 2019-01-01 DIAGNOSIS — D696 Thrombocytopenia, unspecified: Secondary | ICD-10-CM | POA: Diagnosis not present

## 2019-01-01 DIAGNOSIS — I110 Hypertensive heart disease with heart failure: Secondary | ICD-10-CM | POA: Insufficient documentation

## 2019-01-01 DIAGNOSIS — R1314 Dysphagia, pharyngoesophageal phase: Secondary | ICD-10-CM | POA: Diagnosis not present

## 2019-01-01 DIAGNOSIS — M1990 Unspecified osteoarthritis, unspecified site: Secondary | ICD-10-CM | POA: Diagnosis not present

## 2019-01-01 DIAGNOSIS — E876 Hypokalemia: Secondary | ICD-10-CM | POA: Diagnosis not present

## 2019-01-01 DIAGNOSIS — K222 Esophageal obstruction: Secondary | ICD-10-CM | POA: Insufficient documentation

## 2019-01-01 DIAGNOSIS — Z79899 Other long term (current) drug therapy: Secondary | ICD-10-CM | POA: Insufficient documentation

## 2019-01-01 DIAGNOSIS — N189 Chronic kidney disease, unspecified: Secondary | ICD-10-CM | POA: Diagnosis not present

## 2019-01-01 DIAGNOSIS — E1122 Type 2 diabetes mellitus with diabetic chronic kidney disease: Secondary | ICD-10-CM | POA: Insufficient documentation

## 2019-01-01 DIAGNOSIS — J449 Chronic obstructive pulmonary disease, unspecified: Secondary | ICD-10-CM | POA: Insufficient documentation

## 2019-01-01 DIAGNOSIS — Z87891 Personal history of nicotine dependence: Secondary | ICD-10-CM | POA: Insufficient documentation

## 2019-01-01 DIAGNOSIS — R0602 Shortness of breath: Secondary | ICD-10-CM | POA: Insufficient documentation

## 2019-01-01 DIAGNOSIS — K2100 Gastro-esophageal reflux disease with esophagitis, without bleeding: Secondary | ICD-10-CM | POA: Insufficient documentation

## 2019-01-01 DIAGNOSIS — E538 Deficiency of other specified B group vitamins: Secondary | ICD-10-CM | POA: Insufficient documentation

## 2019-01-01 DIAGNOSIS — E119 Type 2 diabetes mellitus without complications: Secondary | ICD-10-CM | POA: Insufficient documentation

## 2019-01-01 DIAGNOSIS — J441 Chronic obstructive pulmonary disease with (acute) exacerbation: Secondary | ICD-10-CM

## 2019-01-01 DIAGNOSIS — I130 Hypertensive heart and chronic kidney disease with heart failure and stage 1 through stage 4 chronic kidney disease, or unspecified chronic kidney disease: Secondary | ICD-10-CM | POA: Diagnosis not present

## 2019-01-01 HISTORY — DX: Other intervertebral disc degeneration, lumbar region: M51.36

## 2019-01-01 HISTORY — DX: Deficiency of other specified B group vitamins: E53.8

## 2019-01-01 HISTORY — DX: Thrombocytopenia, unspecified: D69.6

## 2019-01-01 HISTORY — DX: Atrial premature depolarization: I49.1

## 2019-01-01 HISTORY — PX: ESOPHAGOGASTRODUODENOSCOPY (EGD) WITH PROPOFOL: SHX5813

## 2019-01-01 HISTORY — DX: Heart failure, unspecified: I50.9

## 2019-01-01 HISTORY — DX: Other intervertebral disc degeneration, lumbar region without mention of lumbar back pain or lower extremity pain: M51.369

## 2019-01-01 LAB — CBC WITH DIFFERENTIAL/PLATELET
Abs Immature Granulocytes: 0.03 10*3/uL (ref 0.00–0.07)
Basophils Absolute: 0 10*3/uL (ref 0.0–0.1)
Basophils Relative: 0 %
Eosinophils Absolute: 0 10*3/uL (ref 0.0–0.5)
Eosinophils Relative: 0 %
HCT: 50.6 % (ref 39.0–52.0)
Hemoglobin: 17.5 g/dL — ABNORMAL HIGH (ref 13.0–17.0)
Immature Granulocytes: 0 %
Lymphocytes Relative: 7 %
Lymphs Abs: 0.9 10*3/uL (ref 0.7–4.0)
MCH: 31.1 pg (ref 26.0–34.0)
MCHC: 34.6 g/dL (ref 30.0–36.0)
MCV: 90 fL (ref 80.0–100.0)
Monocytes Absolute: 1.2 10*3/uL — ABNORMAL HIGH (ref 0.1–1.0)
Monocytes Relative: 11 %
Neutro Abs: 9.7 10*3/uL — ABNORMAL HIGH (ref 1.7–7.7)
Neutrophils Relative %: 82 %
Platelets: 144 10*3/uL — ABNORMAL LOW (ref 150–400)
RBC: 5.62 MIL/uL (ref 4.22–5.81)
RDW: 12.6 % (ref 11.5–15.5)
WBC: 11.8 10*3/uL — ABNORMAL HIGH (ref 4.0–10.5)
nRBC: 0 % (ref 0.0–0.2)

## 2019-01-01 LAB — BASIC METABOLIC PANEL
Anion gap: 11 (ref 5–15)
BUN: 12 mg/dL (ref 8–23)
CO2: 22 mmol/L (ref 22–32)
Calcium: 9 mg/dL (ref 8.9–10.3)
Chloride: 104 mmol/L (ref 98–111)
Creatinine, Ser: 1.11 mg/dL (ref 0.61–1.24)
GFR calc Af Amer: >60 mL/min (ref >60)
GFR calc non Af Amer: >60 mL/min (ref >60)
Glucose, Bld: 122 mg/dL — ABNORMAL HIGH (ref 70–99)
Potassium: 3.8 mmol/L (ref 3.5–5.1)
Sodium: 137 mmol/L (ref 135–145)

## 2019-01-01 LAB — TROPONIN I (HIGH SENSITIVITY)
Troponin I (High Sensitivity): 11 ng/L (ref <18)
Troponin I (High Sensitivity): 12 ng/L (ref <18)

## 2019-01-01 LAB — KOH PREP
KOH Prep: NONE SEEN
Special Requests: NORMAL

## 2019-01-01 SURGERY — ESOPHAGOGASTRODUODENOSCOPY (EGD) WITH PROPOFOL
Anesthesia: General

## 2019-01-01 MED ORDER — SODIUM CHLORIDE 0.9 % IV SOLN
INTRAVENOUS | Status: DC
  Administered 2019-01-01: 09:00:00 via INTRAVENOUS

## 2019-01-01 MED ORDER — LIDOCAINE HCL (PF) 2 % IJ SOLN
INTRAMUSCULAR | Status: DC | PRN
  Administered 2019-01-01: 100 mg via INTRADERMAL

## 2019-01-01 MED ORDER — PROPOFOL 500 MG/50ML IV EMUL
INTRAVENOUS | Status: DC | PRN
  Administered 2019-01-01: 150 ug/kg/min via INTRAVENOUS

## 2019-01-01 MED ORDER — SOD CITRATE-CITRIC ACID 500-334 MG/5ML PO SOLN
30.0000 mL | Freq: Once | ORAL | Status: DC
  Filled 2019-01-01: qty 30

## 2019-01-01 MED ORDER — PROPOFOL 10 MG/ML IV BOLUS
INTRAVENOUS | Status: DC | PRN
  Administered 2019-01-01: 50 mg via INTRAVENOUS
  Administered 2019-01-01 (×2): 20 mg via INTRAVENOUS

## 2019-01-01 NOTE — Interval H&P Note (Signed)
History and Physical Interval Note:  01/01/2019 8:22 AM  Glenn Welch  has presented today for surgery, with the diagnosis of DYSPHAGIA.  The various methods of treatment have been discussed with the patient and family. After consideration of risks, benefits and other options for treatment, the patient has consented to  Procedure(s): ESOPHAGOGASTRODUODENOSCOPY (EGD) WITH PROPOFOL (N/A) as a surgical intervention.  The patient's history has been reviewed, patient examined, no change in status, stable for surgery.  I have reviewed the patient's chart and labs.  Questions were answered to the patient's satisfaction.     [place], [employer]

## 2019-01-01 NOTE — Anesthesia Procedure Notes (Signed)
Date/Time: 01/01/2019 9:38 AM Performed by: Nelda Marseille, CRNA Pre-anesthesia Checklist: Patient identified, Emergency Drugs available, Suction available, Patient being monitored and Timeout performed Oxygen Delivery Method: Nasal cannula

## 2019-01-01 NOTE — ED Notes (Signed)
Pt presents to ED via POV, pt states had endoscopy this morning with stretching of his esophagus due to difficulty swallowing, pt states was being discharged after procedure and brought back down to his daughter's care when he had sudden onset SOB, pt referred to ED due to low O2 sats. Upon arrival to ED pt's O2 sats 96% on RA. Pt denies feeling SOB, able to speak in complete sentences, respirations even and unlabored, mild dyspnea noted with exertion upon arrival to room.

## 2019-01-01 NOTE — ED Provider Notes (Signed)
[facility] Emergency Department Provider Note ____________________________________________  Time seen: 1539  I have reviewed the triage vital signs and the nursing notes.  HISTORY  Chief Complaint  Shortness of Breath  HPI Glenn Welch is a 73 y.o. male with a hx of esophageal dysphagia, GERD (well controlled), and esophageal stricture/web requiring esophageal dilation in the past. he presented today for an outpatient EGD procedure. He reports the procedure was performed without incident, except for some recorded O2 sats at 92-94%. He reportedly felt "winded" following the procedure, and was advised to report to the ED for further evaluation, as he was being discharged. He reports using his rescue inhaler 2 puffs x 2 doses within 20 minutes, and has felt improved.   Past Medical History:  Diagnosis Date  . Arthritis   . B12 deficiency   . CHF (congestive heart failure) (HCC)    chronic systolic CHF  . Chronic kidney disease   . COPD (chronic obstructive pulmonary disease) ([place])   . Degenerative disc disease, lumbar   . Diabetes mellitus without complication ([place])   . Dyspnea   . HNP (herniated nucleus pulposus)   . Hypertension   . Hypokalemia   . Lumbar radiculitis   . PAC (premature atrial contraction)   . Pure hypercholesterolemia   . Thrombocytopenia ([place])     There are no active problems to display for this patient.   Past Surgical History:  Procedure Laterality Date  . BRAIN TUMOR EXCISION     benign  . COLONOSCOPY WITH PROPOFOL N/A 11/19/2015   Procedure: COLONOSCOPY WITH PROPOFOL;  Surgeon: Manya Silvas, MD;  Location: [facility] ENDOSCOPY;  Service: Endoscopy;  Laterality: N/A;  . upper lobe of left lung      Prior to Admission medications   Medication Sig Start Date End Date Taking? Authorizing Provider  albuterol (PROVENTIL HFA;VENTOLIN HFA) 108 (90 Base) MCG/ACT inhaler Inhale into the lungs every 4 (four) hours as needed for  wheezing or shortness of breath.    [provider]  amLODipine (NORVASC) 10 MG tablet Take 10 mg by mouth daily.    [provider]  gabapentin (NEURONTIN) 300 MG capsule Take 300 mg by mouth at bedtime.    [provider]  lisinopril (PRINIVIL,ZESTRIL) 40 MG tablet Take 40 mg by mouth daily.    [provider]  lovastatin (MEVACOR) 40 MG tablet Take 40 mg by mouth at bedtime.    [provider]  metoprolol succinate (TOPROL-XL) 25 MG 24 hr tablet Take 50 mg by mouth daily.     [provider]  omeprazole (PRILOSEC) 20 MG capsule Take 20 mg by mouth daily.    [provider]  potassium chloride SA (K-DUR,KLOR-CON) 20 MEQ tablet Take 20 mEq by mouth.     [provider]  spironolactone (ALDACTONE) 25 MG tablet Take 25 mg by mouth daily.    [provider]  tiotropium (SPIRIVA) 18 MCG inhalation capsule Place 18 mcg into inhaler and inhale daily.    [provider]    Allergies Patient has no known allergies.  No family history on file.  Social History Social History   Tobacco Use  . Smoking status: Former Research scientist (life sciences)  . Smokeless tobacco: Never Used  Substance Use Topics  . Alcohol use: Not on file  . Drug use: Not on file    Review of Systems  Constitutional: Negative for fever. Eyes: Negative for visual changes. ENT: Negative for sore throat. Cardiovascular: Negative for  chest pain. Respiratory: Positive for shortness of breath. Gastrointestinal: Negative for abdominal pain, vomiting and diarrhea. Genitourinary: Negative for dysuria. Musculoskeletal: Negative for back pain. Skin: Negative for rash. Neurological: Negative for headaches, focal weakness or numbness. ____________________________________________  PHYSICAL EXAM:  VITAL SIGNS: ED Triage Vitals  Enc Vitals Group     BP 01/01/19 1419 122/69     Pulse Rate 01/01/19 1419 85     Resp 01/01/19 1419 18     Temp 01/01/19 1419  97.6 F (36.4 C)     Temp Source 01/01/19 1419 Oral     SpO2 01/01/19 1419 96 %     Weight 01/01/19 1420 143 lb 4.8 oz (65 kg)     Height 01/01/19 1420 5\' 9"  (1.753 m)     Head Circumference --      Peak Flow --      Pain Score 01/01/19 1420 0     Pain Loc --      Pain Edu? --      Excl. in [place]? --     Constitutional: Alert and oriented. Well appearing and in no distress. Head: Normocephalic and atraumatic. Eyes: Conjunctivae are normal. Normal extraocular movements Cardiovascular: Normal rate, regular rhythm. Normal distal pulses. Respiratory: Normal respiratory effort. No wheezes/rales/rhonchi. Gastrointestinal: Soft and nontender. No distention. Musculoskeletal: Nontender with normal range of motion in all extremities.  Neurologic:  Normal gait without ataxia. Normal speech and language. No gross focal neurologic deficits are appreciated. Skin:  Skin is warm, dry and intact. No rash noted. ____________________________________________     Labs Reviewed  CBC WITH DIFFERENTIAL/PLATELET - Abnormal; Notable for the following components:      Result Value   WBC 11.8 (*)    Hemoglobin 17.5 (*)    Platelets 144 (*)    Neutro Abs 9.7 (*)    Monocytes Absolute 1.2 (*)    All other components within normal limits  BASIC METABOLIC PANEL - Abnormal; Notable for the following components:   Glucose, Bld 122 (*)    All other components within normal limits  TROPONIN I (HIGH SENSITIVITY)  TROPONIN I (HIGH SENSITIVITY)  ____________________________________________  EKG  NSR 83 bpm LVH Left axis deviation PR interval 192 ms QRS duration 130 ms No STEMI ____________________________________________   RADIOLOGY  CXR  IMPRESSION: Chronic changes without acute abnormality. Previously seen right upper lobe nodule is less prominent than on prior CT examination consistent with some interval scarring.  I, Melvenia Needles, personally viewed and evaluated these images (plain  radiographs) as part of my medical decision making, as well as reviewing the written report by the radiologist. ____________________________________________  PROCEDURES  Ambulatory O2 Sats - 93-94% Procedures ____________________________________________  INITIAL IMPRESSION / ASSESSMENT AND PLAN / ED COURSE  Differential includes, but is not limited to, viral syndrome, bronchitis including COPD exacerbation, pneumonia, asthma, CHF including exacerbation with or without pulmonary/interstitial edema, pneumothorax, ACS, thoracic trauma, and esophageal perforation post procedure.  He is reporting that he is better overall. His labs, CXR, and v/s have remained stable during his course. His O2 sats are reassuring at this time. Symptoms may represent a COPD exacerbation. He will be discharged to continue with his home meds and follow-up with his provider as needed. Return precautions have been reviewed.   Glenn Welch was evaluated in Emergency Department on 01/01/2019 for the symptoms described in the history of present illness. He was evaluated in the context of the global COVID-19 pandemic, which necessitated consideration that the patient might be  at risk for infection with the SARS-CoV-2 virus that causes COVID-19. Institutional protocols and algorithms that pertain to the evaluation of patients at risk for COVID-19 are in a state of rapid change based on information released by regulatory bodies including the CDC and federal and state organizations. These policies and algorithms were followed during the patient's care in the ED. ____________________________________________  FINAL CLINICAL IMPRESSION(S) / ED DIAGNOSES  Final diagnoses:  SOB (shortness of breath)  COPD exacerbation (HCC)      Carmie End, Dannielle Karvonen, PA-C 01/01/19 1727    Vanessa Orosi, MD 01/01/19 [id number]

## 2019-01-01 NOTE — Anesthesia Post-op Follow-up Note (Signed)
Anesthesia QCDR form completed.        

## 2019-01-01 NOTE — ED Notes (Signed)
NAD noted at time of D/C. Pt's daughter Steffanie Dunn notified of patient's D/C, states will come pick patient up. Pt denies comments/concerns regarding D/C at this time, pt taken to lobby via wheelchair.

## 2019-01-01 NOTE — Anesthesia Postprocedure Evaluation (Signed)
Anesthesia Post Note  Patient: Glenn Welch  Procedure(s) Performed: ESOPHAGOGASTRODUODENOSCOPY (EGD) WITH PROPOFOL (N/A )  Patient location during evaluation: PACU Anesthesia Type: General Level of consciousness: awake and alert Pain management: pain level controlled Vital Signs Assessment: post-procedure vital signs reviewed and stable Respiratory status: spontaneous breathing, nonlabored ventilation and respiratory function stable Cardiovascular status: blood pressure returned to baseline and stable Postop Assessment: no apparent nausea or vomiting Anesthetic complications: no   Pt had an episode of emesis during the procedure after esophageal dilation.  Oropharynx was suctioned, pt had a brief desaturation and was briefly given positive pressure breaths.  No aspiration was suspected.  In PACU pt reported some mild chest pain, no difficulty breathing.  On exam he was mildly tachypneic but no marked increased WOB, and he stated breathing is comfortable.  Given risk factors and CAD, troponin was obtained which was WNL, and EKG was consistent with prior findings. Pt initially had dips in SpO2 to 88-89% but over the subsequent 1-2 hours he maintained SpO2 >92% without supplemental O2.  Chest discomfort resolved during PACU stay without any treatment.  At this time either a significant aspiration event of ACS seems unlikely and he is felt to be safe for discharge home.  Pt counseled on reasons to seek further medical attention (worsening CP, SOB) and he expressed understanding.  Last Vitals:  Vitals:   01/01/19 1232 01/01/19 1242  BP: 119/70 99/88  Pulse: 67 74  Resp: (!) 29 (!) 21  Temp:    SpO2: 92% 94%    Last Pain:  Vitals:   01/01/19 1242  TempSrc:   PainSc: [name]

## 2019-01-01 NOTE — ED Notes (Signed)
Sent rainbow to lab. 

## 2019-01-01 NOTE — ED Triage Notes (Signed)
Pt reports he went for endoscopy this am- states that when he left he began to feel "winded". Pt states he called back to Endo and they told him to come to ED. Pt reports right now he feels fine and back to his normal. Pt in NAD. 96% on RA.

## 2019-01-01 NOTE — Anesthesia Preprocedure Evaluation (Addendum)
Anesthesia Evaluation  Patient identified by MRN, date of birth, ID band Patient awake    Reviewed: Allergy & Precautions, H&P , NPO status , Patient's Chart, lab work & pertinent test results  Airway Mallampati: II  TM Distance: >3 FB     Dental  (+) Edentulous Lower, Edentulous Upper   Pulmonary shortness of breath and with exertion, COPD,  COPD inhaler, neg recent URI, former smoker,           Cardiovascular hypertension, +CHF    2019 pharmacologic stress test WNL   Neuro/Psych negative neurological ROS  negative psych ROS   GI/Hepatic negative GI ROS, Neg liver ROS,   Endo/Other  diabetes  Renal/GU CRFRenal disease  negative genitourinary   Musculoskeletal   Abdominal   Peds  Hematology negative hematology ROS (+)   Anesthesia Other Findings Past Medical History: No date: Arthritis No date: B12 deficiency No date: CHF (congestive heart failure) (HCC)     Comment:  chronic systolic CHF No date: Chronic kidney disease No date: COPD (chronic obstructive pulmonary disease) (HCC) No date: Degenerative disc disease, lumbar No date: Diabetes mellitus without complication (HCC) No date: Dyspnea No date: HNP (herniated nucleus pulposus) No date: Hypertension No date: Hypokalemia No date: Lumbar radiculitis No date: PAC (premature atrial contraction) No date: Pure hypercholesterolemia No date: Thrombocytopenia ([place])  Past Surgical History: No date: BRAIN TUMOR EXCISION     Comment:  benign 11/19/2015: COLONOSCOPY WITH PROPOFOL; N/A     Comment:  Procedure: COLONOSCOPY WITH PROPOFOL;  Surgeon: Manya Silvas, MD;  Location: [facility] ENDOSCOPY;  Service:               Endoscopy;  Laterality: N/A; No date: upper lobe of left lung  BMI    Body Mass Index: 21.41 kg/m      Reproductive/Obstetrics negative OB ROS                            Anesthesia Physical Anesthesia  Plan  ASA: III  Anesthesia Plan: General   Post-op Pain Management:    Induction:   PONV Risk Score and Plan: Propofol infusion and TIVA  Airway Management Planned:   Additional Equipment:   Intra-op Plan:   Post-operative Plan:   Informed Consent: I have reviewed the patients History and Physical, chart, labs and discussed the procedure including the risks, benefits and alternatives for the proposed anesthesia with the patient or authorized representative who has indicated his/her understanding and acceptance.     Dental Advisory Given  Plan Discussed with: Anesthesiologist  Anesthesia Plan Comments:         Anesthesia Quick Evaluation

## 2019-01-01 NOTE — Discharge Instructions (Signed)
Your exam, labs, EKG, and CXR are all within normal limits at this time. Continue to use your regular and rescue inhalers as prescribed. Follow-up with your providers or return to the ED as needed.

## 2019-01-01 NOTE — ED Notes (Signed)
Per Dr Kerman Passey- pt okay to be seen in flexcare area, no orders at this time.

## 2019-01-01 NOTE — ED Notes (Signed)
Pt went for walk O2 was between 93/94 pt did good, was alert, and is ready to go.

## 2019-01-01 NOTE — H&P (Signed)
Outpatient short stay form Pre-procedure 01/01/2019 8:21 AM Glenn Welch K. Alice Reichert, M.D.  Primary Physician: Ramonita Lab, M.D.  Reason for visit: Dysphagia, GERD, hx of terminal esophageal web.  History of present illness: Patient is a 73 y/o male with a hx of esophageal dysphagia, GERD (well controlled), and esophageal stricture/web requiring esophageal dilation in the past.     Current Facility-Administered Medications:  .  0.9 %  sodium chloride infusion, , Intravenous, Continuous, [name], Benay Pike, MD  Medications Prior to Admission  Medication Sig Dispense Refill Last Dose  . albuterol (PROVENTIL HFA;VENTOLIN HFA) 108 (90 Base) MCG/ACT inhaler Inhale into the lungs every 4 (four) hours as needed for wheezing or shortness of breath.     Marland Kitchen amLODipine (NORVASC) 10 MG tablet Take 10 mg by mouth daily.     Marland Kitchen gabapentin (NEURONTIN) 300 MG capsule Take 300 mg by mouth at bedtime.     Marland Kitchen lisinopril (PRINIVIL,ZESTRIL) 40 MG tablet Take 40 mg by mouth daily.     Marland Kitchen lovastatin (MEVACOR) 40 MG tablet Take 40 mg by mouth at bedtime.     . metoprolol succinate (TOPROL-XL) 25 MG 24 hr tablet Take 50 mg by mouth daily.      Marland Kitchen omeprazole (PRILOSEC) 20 MG capsule Take 20 mg by mouth daily.     . potassium chloride SA (K-DUR,KLOR-CON) 20 MEQ tablet Take 20 mEq by mouth.      . spironolactone (ALDACTONE) 25 MG tablet Take 25 mg by mouth daily.     Marland Kitchen tiotropium (SPIRIVA) 18 MCG inhalation capsule Place 18 mcg into inhaler and inhale daily.        No Known Allergies   Past Medical History:  Diagnosis Date  . Arthritis   . B12 deficiency   . CHF (congestive heart failure) (HCC)    chronic systolic CHF  . Chronic kidney disease   . COPD (chronic obstructive pulmonary disease) ([place])   . Degenerative disc disease, lumbar   . Diabetes mellitus without complication ([place])   . Dyspnea   . HNP (herniated nucleus pulposus)   . Hypertension   . Hypokalemia   . Lumbar radiculitis   . PAC (premature atrial  contraction)   . Pure hypercholesterolemia   . Thrombocytopenia ([place])     Review of systems:  Otherwise negative.    Physical Exam  Gen: Alert, oriented. Appears stated age.  HEENT: [place]/AT. PERRLA. Lungs: CTA, no wheezes. CV: RR nl S1, S2. Abd: soft, benign, no masses. BS+ Ext: No edema. Pulses 2+    Planned procedures: Proceed with EGD. The patient understands the nature of the planned procedure, indications, risks, alternatives and potential complications including but not limited to bleeding, infection, perforation, damage to internal organs and possible oversedation/side effects from anesthesia. The patient agrees and gives consent to proceed.  Please refer to procedure notes for findings, recommendations and patient disposition/instructions.     Shyan Scalisi K. Alice Reichert, M.D. Gastroenterology 01/01/2019  8:21 AM

## 2019-01-01 NOTE — Op Note (Signed)
[facility] Gastroenterology Patient Name: Glenn Welch Procedure Date: 01/01/2019 9:27 AM MRN: [id number] Account #: [id number] Date of Birth: 1945-12-29 Admit Type: Outpatient Age: 73 Room: [facility] ENDO ROOM 1 Gender: Male Note Status: Finalized Procedure:             Upper GI endoscopy Indications:           Esophageal dysphagia, Esophageal reflux, For therapy                         of esophageal stricture Providers:             Benay Pike. Ryenn Howeth MD, MD Medicines:             Propofol per Anesthesia Complications:         No immediate complications. Estimated blood loss:                         Minimal. Procedure:             Pre-Anesthesia Assessment:                        - The risks and benefits of the procedure and the                         sedation options and risks were discussed with the                         patient. All questions were answered and informed                         consent was obtained.                        - Patient identification and proposed procedure were                         verified prior to the procedure by the nurse. The                         procedure was verified in the procedure room.                        - ASA Grade Assessment: III - A patient with severe                         systemic disease.                        - After reviewing the risks and benefits, the patient                         was deemed in satisfactory condition to undergo the                         procedure.                        After obtaining informed consent, the endoscope was  passed under direct vision. Throughout the procedure,                         the patient's blood pressure, pulse, and oxygen                         saturations were monitored continuously. The Endoscope                         was introduced through the mouth, and advanced to the                         third part of duodenum. The  upper GI endoscopy was                         accomplished without difficulty. The patient tolerated                         the procedure well. Findings:      LA Grade A (one or more mucosal breaks less than 5 mm, not extending       between tops of 2 mucosal folds) esophagitis with no bleeding was found       at the gastroesophageal junction. Biopsies were taken with a cold       forceps for histology. Cells for cytology were obtained by brushing.      One benign-appearing, intrinsic moderate stenosis was found 38 to 39 cm       from the incisors. This stenosis measured 1.5 cm (inner diameter) x less       than one cm (in length). The stenosis was traversed. The scope was       withdrawn. Dilation was attempted, but the lesion was not amenable to       treatment with a Maloney dilator because the dilator could not be passed       at 45 Fr. A TTS dilator was passed through the scope. Dilation with an       18-19-20 mm balloon dilator was performed to 20 mm. The dilation site       was examined following endoscope reinsertion and showed complete       resolution of luminal narrowing. Estimated blood loss was minimal.      A 2 cm hiatal hernia was present.      The examined duodenum was normal.      The exam was otherwise without abnormality. Impression:            - LA Grade A erosive esophagitis with no bleeding.                         Biopsied. Cells for cytology obtained.                        - Benign-appearing esophageal stenosis. Unable to                         dilate. Dilated.                        - 2 cm hiatal hernia.                        -  Normal examined duodenum.                        - The examination was otherwise normal. Recommendation:        - Patient has a contact number available for                         emergencies. The signs and symptoms of potential                         delayed complications were discussed with the patient.                          Return to normal activities tomorrow. Written                         discharge instructions were provided to the patient.                        - Resume previous diet.                        - Continue present medications.                        - Await pathology results.                        - Repeat upper endoscopy after studies are complete                         for retreatment.                        - Return to physician assistant in 6 weeks.                        - Please follow up with Tammi Klippel, PA-C in [ ]                          months.                        - The findings and recommendations were discussed with                         the patient. Procedure Code(s):     --- Professional ---                        [phone], Esophagogastroduodenoscopy, flexible,                         transoral; with transendoscopic balloon dilation of                         esophagus (less than 30 mm diameter)                        43239, 59, Esophagogastroduodenoscopy, flexible,  transoral; with biopsy, single or multiple Diagnosis Code(s):     --- Professional ---                        K21.9, Gastro-esophageal reflux disease without                         esophagitis                        R13.14, Dysphagia, pharyngoesophageal phase                        K44.9, Diaphragmatic hernia without obstruction or                         gangrene                        K22.2, Esophageal obstruction                        K20.80 CPT copyright 2019 American Medical Association. All rights reserved. The codes documented in this report are preliminary and upon coder review may  be revised to meet current compliance requirements. Efrain Sella MD, MD 01/01/2019 9:54:58 AM This report has been signed electronically. Number of Addenda: 0 Note Initiated On: 01/01/2019 9:27 AM Estimated Blood Loss:  Estimated blood loss was minimal.      [facility]

## 2019-01-01 NOTE — Transfer of Care (Signed)
Immediate Anesthesia Transfer of Care Note  Patient: Glenn Welch  Procedure(s) Performed: ESOPHAGOGASTRODUODENOSCOPY (EGD) WITH PROPOFOL (N/A )  Patient Location: PACU  Anesthesia Type:General  Level of Consciousness: awake and sedated  Airway & Oxygen Therapy: Patient Spontanous Breathing and Patient connected to nasal cannula oxygen  Post-op Assessment: Report given to RN and Post -op Vital signs reviewed and stable  Post vital signs: Reviewed and stable  Last Vitals:  Vitals Value Taken Time  BP 91/64 01/01/19 1002  Temp 36.1 C 01/01/19 1002  Pulse 70 01/01/19 1002  Resp 20 01/01/19 1002  SpO2 92 % 01/01/19 1002    Last Pain:  Vitals:   01/01/19 1002  TempSrc: Tympanic  PainSc: 0-No pain         Complications: No apparent anesthesia complications

## 2019-01-02 ENCOUNTER — Encounter: Payer: Self-pay | Admitting: Internal Medicine

## 2019-01-02 LAB — SURGICAL PATHOLOGY

## 2019-01-10 DIAGNOSIS — C4491 Basal cell carcinoma of skin, unspecified: Secondary | ICD-10-CM

## 2019-01-10 HISTORY — DX: Basal cell carcinoma of skin, unspecified: C44.91

## 2019-02-21 DIAGNOSIS — D239 Other benign neoplasm of skin, unspecified: Secondary | ICD-10-CM

## 2019-02-21 HISTORY — DX: Other benign neoplasm of skin, unspecified: D23.9

## 2019-08-29 ENCOUNTER — Ambulatory Visit: Payer: Medicare Other | Admitting: Dermatology

## 2019-08-29 ENCOUNTER — Other Ambulatory Visit: Payer: Self-pay

## 2019-08-29 ENCOUNTER — Encounter: Payer: Self-pay | Admitting: Dermatology

## 2019-08-29 DIAGNOSIS — D2371 Other benign neoplasm of skin of right lower limb, including hip: Secondary | ICD-10-CM

## 2019-08-29 DIAGNOSIS — Z86018 Personal history of other benign neoplasm: Secondary | ICD-10-CM

## 2019-08-29 DIAGNOSIS — Z1283 Encounter for screening for malignant neoplasm of skin: Secondary | ICD-10-CM | POA: Diagnosis not present

## 2019-08-29 DIAGNOSIS — Z85828 Personal history of other malignant neoplasm of skin: Secondary | ICD-10-CM | POA: Diagnosis not present

## 2019-08-29 DIAGNOSIS — D239 Other benign neoplasm of skin, unspecified: Secondary | ICD-10-CM

## 2019-08-29 DIAGNOSIS — D489 Neoplasm of uncertain behavior, unspecified: Secondary | ICD-10-CM

## 2019-08-29 DIAGNOSIS — L821 Other seborrheic keratosis: Secondary | ICD-10-CM

## 2019-08-29 DIAGNOSIS — L814 Other melanin hyperpigmentation: Secondary | ICD-10-CM

## 2019-08-29 DIAGNOSIS — D229 Melanocytic nevi, unspecified: Secondary | ICD-10-CM

## 2019-08-29 DIAGNOSIS — L578 Other skin changes due to chronic exposure to nonionizing radiation: Secondary | ICD-10-CM

## 2019-08-29 DIAGNOSIS — D1800 Hemangioma unspecified site: Secondary | ICD-10-CM

## 2019-08-29 NOTE — Progress Notes (Signed)
   Follow-Up Visit   Subjective  Glenn Welch is a 74 y.o. male who presents for the following: TBSE.  Patient presents today for annual TBSE and skin cancer screening, has no concerns today, h/o BCC treated with [facility] Feb 2021 and severe dysplastic nevus removed with excision in Jan 2021.   The following portions of the chart were reviewed this encounter and updated as appropriate:  Tobacco  Allergies  Meds  Problems  Med Hx  Surg Hx  Fam Hx      Review of Systems:  No other skin or systemic complaints except as noted in HPI or Assessment and Plan.  Objective  Well appearing patient in no apparent distress; mood and affect are within normal limits.  A full examination was performed including scalp, head, eyes, ears, nose, lips, neck, chest, axillae, abdomen, back, buttocks, bilateral upper extremities, bilateral lower extremities, hands, feet, fingers, toes, fingernails, and toenails. All findings within normal limits unless otherwise noted below.  Objective  Right Upper Back: Well healed scar with no evidence of recurrence.   Objective  Mid Back: Scar with no evidence of recurrence.   Objective  Right  Neck: 2.3 cm firm subcutaneous nodule  Objective  Right posterior thigh: Firm pink/brown papulenodule with dimple sign.    Assessment & Plan    History of basal cell carcinoma (BCC) Right Upper Back  Treated with [facility] Feb 2021  - No evidence of recurrence today - Recommend regular full body skin exams - Recommend daily broad spectrum sunscreen SPF 30+ to sun-exposed areas, reapply every 2 hours as needed.  - Call if any new or changing lesions are noted between office visits   History of dysplastic nevus Mid Back  Severe atypia, excision Jan. 2021  - No evidence of recurrence today - No lymphadenopathy - Recommend regular full body skin exams - Recommend daily broad spectrum sunscreen SPF 30+ to sun-exposed areas, reapply every 2 hours as needed.  -  Call if any new or changing lesions are noted between office visits   Neoplasm of uncertain behavior Right  Neck  US Soft Tissue Head/Neck (NON-THYROID)  Ultrasound ordered today  Lymphadenopathy vs lipoma vs other  Dermatofibroma Right posterior thigh  The patient will observe these symptoms, and report promptly any worsening or unexpected persistence.  If well, may return prn.    Lentigines - Scattered tan macules - Discussed due to sun exposure - Benign, observe - Call for any changes  Seborrheic Keratoses - Stuck-on, waxy, tan-brown papules and plaques  - Discussed benign etiology and prognosis. - Observe - Call for any changes  Melanocytic Nevi - Tan-brown and/or pink-flesh-colored symmetric macules and papules - Benign appearing on exam today - Observation - Call clinic for new or changing moles - Recommend daily use of broad spectrum spf 30+ sunscreen to sun-exposed areas.   Hemangiomas - Red papules - Discussed benign nature - Observe - Call for any changes  Actinic Damage - diffuse scaly erythematous macules with underlying dyspigmentation - Recommend daily broad spectrum sunscreen SPF 30+ to sun-exposed areas, reapply every 2 hours as needed.  - Call for new or changing lesions.  Skin cancer screening performed today.   Return in about 6 months (around 02/29/2020) for TBSE.  I, Donzetta Kohut, CMA, am acting as scribe for Forest Gleason, MD .  Documentation: I have reviewed the above documentation for accuracy and completeness, and I agree with the above.  Forest Gleason, MD

## 2019-08-29 NOTE — Patient Instructions (Signed)
Recommend daily broad spectrum sunscreen SPF 30+ to sun-exposed areas, reapply every 2 hours as needed. Call for new or changing lesions.  

## 2019-09-11 ENCOUNTER — Encounter: Payer: Self-pay | Admitting: Dermatology

## 2019-09-24 ENCOUNTER — Telehealth: Payer: Self-pay | Admitting: Dermatology

## 2019-09-24 NOTE — Telephone Encounter (Signed)
When I looked in his chart the Ultra sound was not completed, so I re faxed the order over to [facility]. Will follow up tomorrow

## 2019-09-24 NOTE — Telephone Encounter (Signed)
Hi Tammy, Can you please check on the status of Glenn Welch' ultrasound? I am not sure whether he has to call there or if they call to schedule but it does not look like he's had it done. Thank you!

## 2019-09-27 NOTE — Telephone Encounter (Signed)
Hi Tammy, Any update on this? Thanks!

## 2019-09-27 NOTE — Telephone Encounter (Signed)
[facility], they had no appointment for him, however an appointment was made for 10/03/19 AT 4pm. Called patient and informed him of said appointment and that he would need to arrived 1/2 hour early to check in at the Radiology desk. I also informed patient that after Dr. Laurence Ferrari go the results we would be calling him back with Dr. Laurence Ferrari recommendations.

## 2019-10-03 ENCOUNTER — Ambulatory Visit
Admission: RE | Admit: 2019-10-03 | Discharge: 2019-10-03 | Disposition: A | Discharge status: Home / Self Care | Payer: Medicare Other | Source: Ambulatory Visit | Attending: Dermatology | Admitting: Dermatology

## 2019-10-03 ENCOUNTER — Other Ambulatory Visit: Payer: Self-pay

## 2019-10-03 DIAGNOSIS — D489 Neoplasm of uncertain behavior, unspecified: Secondary | ICD-10-CM | POA: Insufficient documentation

## 2019-10-03 NOTE — Progress Notes (Signed)
Ultrasound of mass at neck results showed:  IMPRESSION: The patient's palpable area of concern corresponds to a 2.9 cm mass that is most consistent with a benign lipoma. No further follow-up is recommended in the absence of interval growth.  Ultrasound was most consistent with a benign fatty growth. No treatment is needed as long as this is not significantly growing or changing.   MAs please call

## 2019-12-06 ENCOUNTER — Other Ambulatory Visit: Payer: Self-pay | Admitting: Internal Medicine

## 2019-12-06 ENCOUNTER — Other Ambulatory Visit (HOSPITAL_COMMUNITY): Payer: Self-pay | Admitting: Internal Medicine

## 2019-12-06 DIAGNOSIS — R9389 Abnormal findings on diagnostic imaging of other specified body structures: Secondary | ICD-10-CM

## 2019-12-06 DIAGNOSIS — R911 Solitary pulmonary nodule: Secondary | ICD-10-CM

## 2019-12-18 ENCOUNTER — Other Ambulatory Visit: Payer: Self-pay

## 2019-12-18 ENCOUNTER — Ambulatory Visit
Admission: RE | Admit: 2019-12-18 | Discharge: 2019-12-18 | Disposition: A | Discharge status: Home / Self Care | Payer: Medicare Other | Source: Ambulatory Visit | Attending: Internal Medicine | Admitting: Internal Medicine

## 2019-12-18 DIAGNOSIS — R911 Solitary pulmonary nodule: Secondary | ICD-10-CM | POA: Insufficient documentation

## 2019-12-18 DIAGNOSIS — R9389 Abnormal findings on diagnostic imaging of other specified body structures: Secondary | ICD-10-CM | POA: Diagnosis present

## 2020-03-12 ENCOUNTER — Ambulatory Visit
Admission: RE | Admit: 2020-03-12 | Discharge: 2020-03-12 | Disposition: A | Discharge status: Home / Self Care | Payer: Medicare Other | Source: Ambulatory Visit | Attending: Internal Medicine | Admitting: Internal Medicine

## 2020-03-12 ENCOUNTER — Other Ambulatory Visit: Payer: Self-pay

## 2020-03-12 ENCOUNTER — Other Ambulatory Visit: Payer: Self-pay | Admitting: Internal Medicine

## 2020-03-12 ENCOUNTER — Other Ambulatory Visit
Admission: RE | Admit: 2020-03-12 | Discharge: 2020-03-12 | Disposition: A | Discharge status: Home / Self Care | Payer: Medicare Other | Source: Ambulatory Visit | Attending: Internal Medicine | Admitting: Internal Medicine

## 2020-03-12 ENCOUNTER — Other Ambulatory Visit (HOSPITAL_COMMUNITY): Payer: Self-pay | Admitting: Internal Medicine

## 2020-03-12 DIAGNOSIS — R079 Chest pain, unspecified: Secondary | ICD-10-CM

## 2020-03-12 DIAGNOSIS — I10 Essential (primary) hypertension: Secondary | ICD-10-CM | POA: Diagnosis present

## 2020-03-12 DIAGNOSIS — R9389 Abnormal findings on diagnostic imaging of other specified body structures: Secondary | ICD-10-CM | POA: Diagnosis present

## 2020-03-12 DIAGNOSIS — E538 Deficiency of other specified B group vitamins: Secondary | ICD-10-CM | POA: Insufficient documentation

## 2020-03-12 DIAGNOSIS — E118 Type 2 diabetes mellitus with unspecified complications: Secondary | ICD-10-CM | POA: Insufficient documentation

## 2020-03-12 DIAGNOSIS — I5022 Chronic systolic (congestive) heart failure: Secondary | ICD-10-CM | POA: Insufficient documentation

## 2020-03-12 DIAGNOSIS — N1831 Chronic kidney disease, stage 3a: Secondary | ICD-10-CM | POA: Insufficient documentation

## 2020-03-12 DIAGNOSIS — D696 Thrombocytopenia, unspecified: Secondary | ICD-10-CM | POA: Diagnosis present

## 2020-03-12 DIAGNOSIS — J438 Other emphysema: Secondary | ICD-10-CM | POA: Insufficient documentation

## 2020-03-12 DIAGNOSIS — M5136 Other intervertebral disc degeneration, lumbar region: Secondary | ICD-10-CM | POA: Diagnosis present

## 2020-03-12 DIAGNOSIS — E7800 Pure hypercholesterolemia, unspecified: Secondary | ICD-10-CM | POA: Diagnosis present

## 2020-03-12 HISTORY — DX: Unspecified asthma, uncomplicated: J45.909

## 2020-03-12 LAB — TROPONIN I (HIGH SENSITIVITY): Troponin I (High Sensitivity): 14 ng/L (ref <18)

## 2020-03-12 LAB — D-DIMER, QUANTITATIVE: D-Dimer, Quant: 0.29 ug/mL-FEU (ref 0.00–0.50)

## 2020-03-12 LAB — POCT I-STAT CREATININE: Creatinine, Ser: 1.2 mg/dL (ref 0.61–1.24)

## 2020-03-12 MED ORDER — IOHEXOL 350 MG/ML SOLN
100.0000 mL | Freq: Once | INTRAVENOUS | Status: AC | PRN
  Administered 2020-03-12: 100 mL via INTRAVENOUS

## 2020-03-13 ENCOUNTER — Encounter: Payer: Medicare Other | Admitting: Dermatology

## 2020-03-13 ENCOUNTER — Ambulatory Visit: Payer: Medicare Other | Admitting: Dermatology

## 2020-04-02 ENCOUNTER — Other Ambulatory Visit: Payer: Self-pay

## 2020-04-02 ENCOUNTER — Other Ambulatory Visit: Payer: Self-pay | Admitting: Family Medicine

## 2020-04-02 ENCOUNTER — Ambulatory Visit
Admission: RE | Admit: 2020-04-02 | Discharge: 2020-04-02 | Disposition: A | Discharge status: Home / Self Care | Payer: Medicare Other | Source: Ambulatory Visit | Attending: Family Medicine | Admitting: Family Medicine

## 2020-04-02 ENCOUNTER — Other Ambulatory Visit (HOSPITAL_COMMUNITY): Payer: Self-pay | Admitting: Family Medicine

## 2020-04-02 DIAGNOSIS — M549 Dorsalgia, unspecified: Secondary | ICD-10-CM

## 2020-07-03 ENCOUNTER — Other Ambulatory Visit: Payer: Self-pay

## 2020-07-03 ENCOUNTER — Ambulatory Visit: Payer: Medicare Other | Admitting: Dermatology

## 2020-07-03 DIAGNOSIS — L821 Other seborrheic keratosis: Secondary | ICD-10-CM

## 2020-07-03 DIAGNOSIS — D225 Melanocytic nevi of trunk: Secondary | ICD-10-CM

## 2020-07-03 DIAGNOSIS — D229 Melanocytic nevi, unspecified: Secondary | ICD-10-CM

## 2020-07-03 DIAGNOSIS — L578 Other skin changes due to chronic exposure to nonionizing radiation: Secondary | ICD-10-CM

## 2020-07-03 DIAGNOSIS — D1800 Hemangioma unspecified site: Secondary | ICD-10-CM

## 2020-07-03 DIAGNOSIS — L814 Other melanin hyperpigmentation: Secondary | ICD-10-CM

## 2020-07-03 DIAGNOSIS — Z85828 Personal history of other malignant neoplasm of skin: Secondary | ICD-10-CM | POA: Diagnosis not present

## 2020-07-03 DIAGNOSIS — Z1283 Encounter for screening for malignant neoplasm of skin: Secondary | ICD-10-CM

## 2020-07-03 DIAGNOSIS — Z86018 Personal history of other benign neoplasm: Secondary | ICD-10-CM

## 2020-07-03 DIAGNOSIS — D2371 Other benign neoplasm of skin of right lower limb, including hip: Secondary | ICD-10-CM | POA: Diagnosis not present

## 2020-07-03 DIAGNOSIS — D485 Neoplasm of uncertain behavior of skin: Secondary | ICD-10-CM

## 2020-07-03 DIAGNOSIS — D239 Other benign neoplasm of skin, unspecified: Secondary | ICD-10-CM

## 2020-07-03 NOTE — Patient Instructions (Addendum)
Wound Care Instructions  Cleanse wound gently with soap and water once a day then pat dry with clean gauze. Apply a thing coat of Petrolatum (petroleum jelly, "Vaseline") over the wound (unless you have an allergy to this). We recommend that you use a new, sterile tube of Vaseline. Do not pick or remove scabs. Do not remove the yellow or white "healing tissue" from the base of the wound.  Cover the wound with fresh, clean, nonstick gauze and secure with paper tape. You may use Band-Aids in place of gauze and tape if the would is small enough, but would recommend trimming much of the tape off as there is often too much. Sometimes Band-Aids can irritate the skin.  You should call the office for your biopsy report after 1 week if you have not already been contacted.  If you experience any problems, such as abnormal amounts of bleeding, swelling, significant bruising, significant pain, or evidence of infection, please call the office immediately.  FOR ADULT SURGERY PATIENTS: If you need something for pain relief you may take 1 extra strength Tylenol (acetaminophen) AND 2 Ibuprofen (200mg each) together every 4 hours as needed for pain. (do not take these if you are allergic to them or if you have a reason you should not take them.) Typically, you may only need pain medication for 1 to 3 days.     Melanoma ABCDEs  Melanoma is the most dangerous type of skin cancer, and is the leading cause of death from skin disease.  You are more likely to develop melanoma if you: Have light-colored skin, light-colored eyes, or red or blond hair Spend a lot of time in the sun Tan regularly, either outdoors or in a tanning bed Have had blistering sunburns, especially during childhood Have a close family member who has had a melanoma Have atypical moles or large birthmarks  Early detection of melanoma is key since treatment is typically straightforward and cure rates are extremely high if we catch it early.   The  first sign of melanoma is often a change in a mole or a new dark spot.  The ABCDE system is a way of remembering the signs of melanoma.  A for asymmetry:  The two halves do not match. B for border:  The edges of the growth are irregular. C for color:  A mixture of colors are present instead of an even brown color. D for diameter:  Melanomas are usually (but not always) greater than 6mm - the size of a pencil eraser. E for evolution:  The spot keeps changing in size, shape, and color.  Please check your skin once per month between visits. You can use a small mirror in front and a large mirror behind you to keep an eye on the back side or your body.   If you see any new or changing lesions before your next follow-up, please call to schedule a visit.  Please continue daily skin protection including broad spectrum sunscreen SPF 30+ to sun-exposed areas, reapplying every 2 hours as needed when you're outdoors.    If you have any questions or concerns for your doctor, please call our main line at [phone] and press option 4 to reach your doctor's medical assistant. If no one answers, please leave a voicemail as directed and we will return your call as soon as possible. Messages left after 4 pm will be answered the following business day.   You may also send us a message via MyChart. We   typically respond to MyChart messages within 1-2 business days.  For prescription refills, please ask your pharmacy to contact our office. Our fax number is [phone].  If you have an urgent issue when the clinic is closed that cannot wait until the next business day, you can page your doctor at the number below.    Please note that while we do our best to be available for urgent issues outside of office hours, we are not available 24/7.   If you have an urgent issue and are unable to reach us, you may choose to seek medical care at your doctor's office, retail clinic, urgent care center, or emergency  room.  If you have a medical emergency, please immediately call 911 or go to the emergency department.  Pager Numbers  - Dr. Kowalski: [phone]  - Dr. Moye: [phone]  - Dr. Stewart: [phone]  In the event of inclement weather, please call our main line at [phone] for an update on the status of any delays or closures.  Dermatology Medication Tips: Please keep the boxes that topical medications come in in order to help keep track of the instructions about where and how to use these. Pharmacies typically print the medication instructions only on the boxes and not directly on the medication tubes.   If your medication is too expensive, please contact our office at [phone] option 4 or send us a message through MyChart.   We are unable to tell what your co-pay for medications will be in advance as this is different depending on your insurance coverage. However, we may be able to find a substitute medication at lower cost or fill out paperwork to get insurance to cover a needed medication.   If a prior authorization is required to get your medication covered by your insurance company, please allow us 1-2 business days to complete this process.  Drug prices often vary depending on where the prescription is filled and some pharmacies may offer cheaper prices.  The website www.goodrx.com contains coupons for medications through different pharmacies. The prices here do not account for what the cost may be with help from insurance (it may be cheaper with your insurance), but the website can give you the price if you did not use any insurance.  - You can print the associated coupon and take it with your prescription to the pharmacy.  - You may also stop by our office during regular business hours and pick up a GoodRx coupon card.  - If you need your prescription sent electronically to a different pharmacy, notify our office through [place] MyChart or by phone at [phone]  option 4.  

## 2020-07-03 NOTE — Progress Notes (Signed)
Follow-Up Visit   Subjective  Glenn Welch is a 75 y.o. male who presents for the following: FBSE (Patient here for full body skin exam and skin cancer screening. Patient with hx of dysplastic nevus and BCC. He is not aware of any new or changing spots. ).   The following portions of the chart were reviewed this encounter and updated as appropriate:   Tobacco  Allergies  Meds  Problems  Med Hx  Surg Hx  Fam Hx       Review of Systems:  No other skin or systemic complaints except as noted in HPI or Assessment and Plan.  Objective  Well appearing patient in no apparent distress; mood and affect are within normal limits.  A full examination was performed including scalp, head, eyes, ears, nose, lips, neck, chest, axillae, abdomen, back, buttocks, bilateral upper extremities, bilateral lower extremities, hands, feet, fingers, toes, fingernails, and toenails. All findings within normal limits unless otherwise noted below.  Left Temple 0.9cm medium to dark brown thin papule       Left Mid Back 0.4cm dark brown thin papule        Right Posterior Thigh Firm pink/brown papulenodule with dimple sign.    Assessment & Plan  Neoplasm of uncertain behavior of skin (2) Left Temple  Left Mid Back  Epidermal / dermal shaving  Lesion diameter (cm):  0.4 Informed consent: discussed and consent obtained   Timeout: patient name, date of birth, surgical site, and procedure verified   Patient was prepped and draped in usual sterile fashion: area prepped with isopropyl alcohol. Anesthesia: the lesion was anesthetized in a standard fashion   Local anesthetic: 0.5% bupivicaine. Instrument used: flexible razor blade   Hemostasis achieved with: aluminum chloride   Outcome: patient tolerated procedure well   Post-procedure details: wound care instructions given   Additional details:  Mupirocin and a bandage applied  Specimen 1 - Surgical pathology Differential Diagnosis:  R/o Atypia  Check Margins: No 0.4cm dark brown thin papule   Left temple - Favor thin SK. No significant change from prior photo, will continue to monitor. Benign-appearing.  Call clinic for new or changing lesions.  Recommend daily use of broad spectrum spf 30+ sunscreen to sun-exposed areas.    Dermatofibroma Right Posterior Thigh  Benign-appearing.  Observation.  Call clinic for new or changing lesions.  Recommend daily use of broad spectrum spf 30+ sunscreen to sun-exposed areas.    Lentigines - Scattered tan macules - Due to sun exposure - Benign-appering, observe - Recommend daily broad spectrum sunscreen SPF 30+ to sun-exposed areas, reapply every 2 hours as needed. - Call for any changes  Seborrheic Keratoses - Stuck-on, waxy, tan-brown papules and/or plaques  - Benign-appearing - Discussed benign etiology and prognosis. - Observe - Call for any changes  Melanocytic Nevi - Tan-brown and/or pink-flesh-colored symmetric macules and papules - Benign appearing on exam today - Observation - Call clinic for new or changing moles - Recommend daily use of broad spectrum spf 30+ sunscreen to sun-exposed areas.   Hemangiomas - Red papules - Discussed benign nature - Observe - Call for any changes  Actinic Damage - Chronic condition, secondary to cumulative UV/sun exposure - diffuse scaly erythematous macules with underlying dyspigmentation - Recommend daily broad spectrum sunscreen SPF 30+ to sun-exposed areas, reapply every 2 hours as needed.  - Staying in the shade or wearing long sleeves, sun glasses (UVA+UVB protection) and wide brim hats (4-inch brim around the entire circumference of  the hat) are also recommended for sun protection.  - Call for new or changing lesions.  Skin cancer screening performed today.  History of Basal Cell Carcinoma of the Skin - No evidence of recurrence today at mid upper back - Recommend regular full body skin exams - Recommend  daily broad spectrum sunscreen SPF 30+ to sun-exposed areas, reapply every 2 hours as needed.  - Call if any new or changing lesions are noted between office visits  History of Dysplastic Nevi - No evidence of recurrence today at right mid back, excision - Recommend regular full body skin exams - Recommend daily broad spectrum sunscreen SPF 30+ to sun-exposed areas, reapply every 2 hours as needed.  - Call if any new or changing lesions are noted between office visits  Return in about 6 months (around 01/02/2021) for TBSE.  Graciella Belton, RMA, am acting as scribe for Forest Gleason, MD .  Documentation: I have reviewed the above documentation for accuracy and completeness, and I agree with the above.  Forest Gleason, MD

## 2020-07-10 ENCOUNTER — Telehealth: Payer: Self-pay

## 2020-07-10 NOTE — Telephone Encounter (Signed)
Patient advised bx showed severely atypical mole, scheduled for surgery 08/27/20, JS

## 2020-07-10 NOTE — Telephone Encounter (Signed)
-----   Message from Alfonso Patten, MD sent at 07/10/2020  1:31 PM EDT ----- Skin , left mid back DYSPLASTIC COMPOUND NEVUS WITH SEVERE ATYPIA, INFLAMED, CLOSE TO MARGIN, SEE DESCRIPTION  This is a SEVERELY ATYPICAL MOLE. On the spectrum from normal mole to melanoma skin cancer, this is in between the two but closer towards a melanoma skin cancer.  - The treatment of choice for severely atypical moles is to cut them out in clinic with an area of normal looking skin around them to get all the atypical cells out. The skin that is removed will be sent to check under the microscope again to be sure it looks completely out.   - People who have a history of atypical moles do have a slightly increased risk of developing melanoma somewhere on the body, so a full body skin exam by a dermatologist is recommended at least once a year. - Monthly self skin checks and daily sun protection are also recommended.  - Please also call if you notice any new or changing spots anywhere else on the body before your follow-up visit.   MAs please call and schedule for excision. Thank you!

## 2020-07-16 ENCOUNTER — Encounter: Payer: Self-pay | Admitting: Dermatology

## 2020-08-27 ENCOUNTER — Other Ambulatory Visit: Payer: Self-pay

## 2020-08-27 ENCOUNTER — Ambulatory Visit: Payer: Medicare Other | Admitting: Dermatology

## 2020-08-27 DIAGNOSIS — D225 Melanocytic nevi of trunk: Secondary | ICD-10-CM | POA: Diagnosis not present

## 2020-08-27 DIAGNOSIS — D485 Neoplasm of uncertain behavior of skin: Secondary | ICD-10-CM

## 2020-08-27 MED ORDER — MUPIROCIN 2 % EX OINT: 1.0000 "application " | TOPICAL_OINTMENT | Freq: Every day | CUTANEOUS | 0 refills | Status: DC

## 2020-08-27 NOTE — Progress Notes (Signed)
   Follow-Up Visit   Subjective  Glenn Welch is a 75 y.o. male who presents for the following: Procedure (Patient here today for excision of severely atypical nevus at left mid back. ).   The following portions of the chart were reviewed this encounter and updated as appropriate:   Tobacco  Allergies  Meds  Problems  Med Hx  Surg Hx  Fam Hx      Review of Systems:  No other skin or systemic complaints except as noted in HPI or Assessment and Plan.  Objective  Well appearing patient in no apparent distress; mood and affect are within normal limits.  A focused examination was performed including back. Relevant physical exam findings are noted in the Assessment and Plan.  Left Mid Back Healing biopsy site [phone]   Assessment & Plan  Neoplasm of uncertain behavior of skin Left Mid Back  Skin excision  Lesion length (cm):  1.1 Total excision diameter (cm):  2.1 Informed consent: discussed and consent obtained   Timeout: patient name, date of birth, surgical site, and procedure verified   Procedure prep:  Patient was prepped and draped in usual sterile fashion Prep type:  Chlorhexidine Anesthesia: the lesion was anesthetized in a standard fashion   Anesthetic:  1% lidocaine w/ epinephrine 1-100,000 buffered w/ 8.4% NaHCO3 (3cc lido w/epi, 9cc 0.25% bupivicaine) Hemostasis achieved with: suture, pressure and electrodesiccation   Outcome: patient tolerated procedure well with no complications   Post-procedure details: wound care instructions given   Additional details:  Mupirocin and a pressure dressing applied  Skin repair Complexity:  Intermediate Final length (cm):  6 Informed consent: discussed and consent obtained   Timeout: patient name, date of birth, surgical site, and procedure verified   Procedure prep:  Patient was prepped and draped in usual sterile fashion Prep type:  Chlorhexidine Anesthesia: the lesion was anesthetized in a standard fashion    Anesthetic:  1% lidocaine w/ epinephrine 1-100,000 local infiltration Reason for type of repair: reduce tension to allow closure, reduce the risk of dehiscence, infection, and necrosis, reduce subcutaneous dead space and avoid a hematoma, allow closure of the large defect, allow side-to-side closure without requiring a flap or graft and enhance both functionality and cosmetic results   Undermining: area extensively undermined   Subcutaneous layers (deep stitches):  Suture size:  3-0 and 4-0 Suture type: Vicryl (polyglactin 910)   Stitches:  Buried vertical mattress Fine/surface layer approximation (top stitches):  Suture size:  4-0 Suture type: Prolene (polypropylene)   Suture removal (days):  7 Hemostasis achieved with: pressure and electrodesiccation Outcome: patient tolerated procedure well with no complications   Post-procedure details: wound care instructions given   Additional details:  Mupirocin and a pressure bandage applied   mupirocin ointment (BACTROBAN) 2 % Apply 1 application topically daily. With dressing changes  Specimen 1 - Surgical pathology Differential Diagnosis: BX proven dysplastic nevus with severe atypia  Check Margins: No Healing biopsy site [id number] Tagged at medial tip  Return in about 1 week (around 09/03/2020) for Suture Removal.  Graciella Belton, RMA, am acting as scribe for Forest Gleason, MD .   Documentation: I have reviewed the above documentation for accuracy and completeness, and I agree with the above.  Forest Gleason, MD

## 2020-08-27 NOTE — Patient Instructions (Signed)
Wound Care Instructions  Cleanse wound gently with soap and water once a day then pat dry with clean gauze. Apply a thing coat of Petrolatum (petroleum jelly, "Vaseline") over the wound (unless you have an allergy to this). We recommend that you use a new, sterile tube of Vaseline. Do not pick or remove scabs. Do not remove the yellow or white "healing tissue" from the base of the wound.  Cover the wound with fresh, clean, nonstick gauze and secure with paper tape. You may use Band-Aids in place of gauze and tape if the would is small enough, but would recommend trimming much of the tape off as there is often too much. Sometimes Band-Aids can irritate the skin.  You should call the office for your biopsy report after 1 week if you have not already been contacted.  If you experience any problems, such as abnormal amounts of bleeding, swelling, significant bruising, significant pain, or evidence of infection, please call the office immediately.  FOR ADULT SURGERY PATIENTS: If you need something for pain relief you may take 1 extra strength Tylenol (acetaminophen) AND 2 Ibuprofen (200mg each) together every 4 hours as needed for pain. (do not take these if you are allergic to them or if you have a reason you should not take them.) Typically, you may only need pain medication for 1 to 3 days.   If you have any questions or concerns for your doctor, please call our main line at [phone] and press option 4 to reach your doctor's medical assistant. If no one answers, please leave a voicemail as directed and we will return your call as soon as possible. Messages left after 4 pm will be answered the following business day.   You may also send us a message via MyChart. We typically respond to MyChart messages within 1-2 business days.  For prescription refills, please ask your pharmacy to contact our office. Our fax number is [phone].  If you have an urgent issue when the clinic is closed that  cannot wait until the next business day, you can page your doctor at the number below.    Please note that while we do our best to be available for urgent issues outside of office hours, we are not available 24/7.   If you have an urgent issue and are unable to reach us, you may choose to seek medical care at your doctor's office, retail clinic, urgent care center, or emergency room.  If you have a medical emergency, please immediately call 911 or go to the emergency department.  Pager Numbers  - Dr. Kowalski: [phone]  - Dr. Moye: [phone]  - Dr. Stewart: [phone]  In the event of inclement weather, please call our main line at [phone] for an update on the status of any delays or closures.  Dermatology Medication Tips: Please keep the boxes that topical medications come in in order to help keep track of the instructions about where and how to use these. Pharmacies typically print the medication instructions only on the boxes and not directly on the medication tubes.   If your medication is too expensive, please contact our office at [phone] option 4 or send us a message through MyChart.   We are unable to tell what your co-pay for medications will be in advance as this is different depending on your insurance coverage. However, we may be able to find a substitute medication at lower cost or fill out paperwork to get insurance to cover a needed   medication.   If a prior authorization is required to get your medication covered by your insurance company, please allow us 1-2 business days to complete this process.  Drug prices often vary depending on where the prescription is filled and some pharmacies may offer cheaper prices.  The website www.goodrx.com contains coupons for medications through different pharmacies. The prices here do not account for what the cost may be with help from insurance (it may be cheaper with your insurance), but the website can give you the  price if you did not use any insurance.  - You can print the associated coupon and take it with your prescription to the pharmacy.  - You may also stop by our office during regular business hours and pick up a GoodRx coupon card.  - If you need your prescription sent electronically to a different pharmacy, notify our office through [place] MyChart or by phone at [phone] option 4.   

## 2020-08-28 ENCOUNTER — Telehealth: Payer: Self-pay

## 2020-08-28 NOTE — Telephone Encounter (Signed)
LVM to see how patient is doing following yesterday's surgery.Glenn Welch

## 2020-09-03 ENCOUNTER — Ambulatory Visit (INDEPENDENT_AMBULATORY_CARE_PROVIDER_SITE_OTHER): Payer: Medicare Other | Admitting: Dermatology

## 2020-09-03 ENCOUNTER — Other Ambulatory Visit: Payer: Self-pay

## 2020-09-03 DIAGNOSIS — Z4802 Encounter for removal of sutures: Secondary | ICD-10-CM

## 2020-09-03 NOTE — Patient Instructions (Signed)

## 2020-09-03 NOTE — Progress Notes (Signed)
   Follow-Up Visit   Subjective  XAVEON SHILTZ is a 75 y.o. male who presents for the following: Suture / Staple Removal (Here for suture removal of severely dysplastic nevus at left lateral lower back. ).  The following portions of the chart were reviewed this encounter and updated as appropriate:  Tobacco  Allergies  Meds  Problems  Med Hx  Surg Hx  Fam Hx      Review of Systems: No other skin or systemic complaints except as noted in HPI or Assessment and Plan.   Objective  Well appearing patient in no apparent distress; mood and affect are within normal limits.  A focused examination was performed including back. Relevant physical exam findings are noted in the Assessment and Plan.   Assessment & Plan   Encounter for Removal of Sutures - Incision site at the left back is clean, dry and intact - Wound cleansed, sutures removed, wound cleansed and steri strips applied.  - Discussed pathology results showing margins free  - Patient advised to keep steri-strips dry until they fall off. - Scars remodel for a full year. - Once steri-strips fall off, patient can apply over-the-counter silicone scar cream each night to help with scar remodeling if desired. - Patient advised to call with any concerns or if they notice any new or changing lesions.   Return for TBSE as scheduled.  I, Emelia Salisbury, CMA, am acting as scribe for Forest Gleason, MD.  Documentation: I have reviewed the above documentation for accuracy and completeness, and I agree with the above.  Forest Gleason, MD

## 2020-09-07 ENCOUNTER — Encounter: Payer: Self-pay | Admitting: Dermatology

## 2020-12-05 ENCOUNTER — Ambulatory Visit: Payer: Medicare Other | Admitting: Certified Registered Nurse Anesthetist

## 2020-12-05 ENCOUNTER — Encounter: Payer: Self-pay | Admitting: *Deleted

## 2020-12-05 ENCOUNTER — Encounter
Admission: RE | Disposition: A | Discharge status: Home / Self Care | Payer: Self-pay | Source: Ambulatory Visit | Attending: Gastroenterology

## 2020-12-05 ENCOUNTER — Ambulatory Visit
Admission: RE | Admit: 2020-12-05 | Discharge: 2020-12-05 | Disposition: A | Discharge status: Home / Self Care | Payer: Medicare Other | Source: Ambulatory Visit | Attending: Gastroenterology | Admitting: Gastroenterology

## 2020-12-05 DIAGNOSIS — E1122 Type 2 diabetes mellitus with diabetic chronic kidney disease: Secondary | ICD-10-CM | POA: Diagnosis not present

## 2020-12-05 DIAGNOSIS — Z87891 Personal history of nicotine dependence: Secondary | ICD-10-CM | POA: Insufficient documentation

## 2020-12-05 DIAGNOSIS — K219 Gastro-esophageal reflux disease without esophagitis: Secondary | ICD-10-CM | POA: Insufficient documentation

## 2020-12-05 DIAGNOSIS — I130 Hypertensive heart and chronic kidney disease with heart failure and stage 1 through stage 4 chronic kidney disease, or unspecified chronic kidney disease: Secondary | ICD-10-CM | POA: Diagnosis not present

## 2020-12-05 DIAGNOSIS — Z1211 Encounter for screening for malignant neoplasm of colon: Secondary | ICD-10-CM | POA: Insufficient documentation

## 2020-12-05 DIAGNOSIS — D124 Benign neoplasm of descending colon: Secondary | ICD-10-CM | POA: Insufficient documentation

## 2020-12-05 DIAGNOSIS — K640 First degree hemorrhoids: Secondary | ICD-10-CM | POA: Diagnosis not present

## 2020-12-05 DIAGNOSIS — D125 Benign neoplasm of sigmoid colon: Secondary | ICD-10-CM | POA: Diagnosis not present

## 2020-12-05 DIAGNOSIS — J449 Chronic obstructive pulmonary disease, unspecified: Secondary | ICD-10-CM | POA: Insufficient documentation

## 2020-12-05 DIAGNOSIS — I5022 Chronic systolic (congestive) heart failure: Secondary | ICD-10-CM | POA: Insufficient documentation

## 2020-12-05 DIAGNOSIS — N189 Chronic kidney disease, unspecified: Secondary | ICD-10-CM | POA: Diagnosis not present

## 2020-12-05 DIAGNOSIS — M1990 Unspecified osteoarthritis, unspecified site: Secondary | ICD-10-CM | POA: Diagnosis not present

## 2020-12-05 HISTORY — PX: COLONOSCOPY WITH PROPOFOL: SHX5780

## 2020-12-05 HISTORY — DX: Other nonspecific abnormal finding of lung field: R91.8

## 2020-12-05 HISTORY — DX: Personal history of other infectious and parasitic diseases: Z86.19

## 2020-12-05 HISTORY — DX: Hyperlipidemia, unspecified: E78.5

## 2020-12-05 HISTORY — DX: Hyperaldosteronism, unspecified: E26.9

## 2020-12-05 HISTORY — DX: Gastro-esophageal reflux disease without esophagitis: K21.9

## 2020-12-05 SURGERY — COLONOSCOPY WITH PROPOFOL
Anesthesia: General

## 2020-12-05 MED ORDER — PROPOFOL 500 MG/50ML IV EMUL
INTRAVENOUS | Status: AC
  Filled 2020-12-05: qty 50

## 2020-12-05 MED ORDER — SODIUM CHLORIDE 0.9 % IV SOLN
INTRAVENOUS | Status: DC
  Administered 2020-12-05: 1000 mL via INTRAVENOUS

## 2020-12-05 MED ORDER — LIDOCAINE HCL (PF) 2 % IJ SOLN
INTRAMUSCULAR | Status: AC
  Filled 2020-12-05: qty 5

## 2020-12-05 MED ORDER — LIDOCAINE HCL (CARDIAC) PF 100 MG/5ML IV SOSY
PREFILLED_SYRINGE | INTRAVENOUS | Status: DC | PRN
  Administered 2020-12-05: 50 mg via INTRAVENOUS

## 2020-12-05 MED ORDER — PROPOFOL 500 MG/50ML IV EMUL
INTRAVENOUS | Status: DC | PRN
  Administered 2020-12-05: 150 ug/kg/min via INTRAVENOUS

## 2020-12-05 MED ORDER — PROPOFOL 10 MG/ML IV BOLUS
INTRAVENOUS | Status: DC | PRN
  Administered 2020-12-05: 70 mg via INTRAVENOUS
  Administered 2020-12-05: 20 mg via INTRAVENOUS

## 2020-12-05 NOTE — Op Note (Signed)
[facility] Patient Name: Glenn Welch Procedure Date: 12/05/2020 8:50 AM MRN: [id number] Account #: [id number] Date of Birth: [birth year] Admit Type: Outpatient Age: 75 Room: [facility] ENDO ROOM 3 Gender: Male Note Status: Finalized Instrument Name: Park Meo [id number] Procedure:             Colonoscopy Indications:           Surveillance: Personal history of adenomatous polyps                         on last colonoscopy 5 years ago Providers:             Andrey Farmer MD, MD Referring MD:          Ramonita Lab, MD (Referring MD) Medicines:             Monitored Anesthesia Care Complications:         No immediate complications. Estimated blood loss:                         Minimal. Procedure:             Pre-Anesthesia Assessment:                        - Prior to the procedure, a History and Physical was                         performed, and patient medications and allergies were                         reviewed. The patient is competent. The risks and                         benefits of the procedure and the sedation options and                         risks were discussed with the patient. All questions                         were answered and informed consent was obtained.                         Patient identification and proposed procedure were                         verified by the physician, the nurse, the anesthetist                         and the technician in the endoscopy suite. Mental                         Status Examination: alert and oriented. Airway                         Examination: normal oropharyngeal airway and neck                         mobility. Respiratory Examination: clear to  auscultation. CV Examination: normal. Prophylactic                         Antibiotics: The patient does not require prophylactic                         antibiotics. Prior Anticoagulants: The patient has                          taken no previous anticoagulant or antiplatelet                         agents. ASA Grade Assessment: III - A patient with                         severe systemic disease. After reviewing the risks and                         benefits, the patient was deemed in satisfactory                         condition to undergo the procedure. The anesthesia                         plan was to use monitored anesthesia care (MAC).                         Immediately prior to administration of medications,                         the patient was re-assessed for adequacy to receive                         sedatives. The heart rate, respiratory rate, oxygen                         saturations, blood pressure, adequacy of pulmonary                         ventilation, and response to care were monitored                         throughout the procedure. The physical status of the                         patient was re-assessed after the procedure.                        After obtaining informed consent, the colonoscope was                         passed under direct vision. Throughout the procedure,                         the patient's blood pressure, pulse, and oxygen                         saturations were monitored continuously. The  Colonoscope was introduced through the anus and                         advanced to the the cecum, identified by appendiceal                         orifice and ileocecal valve. The colonoscopy was                         performed without difficulty. The patient tolerated                         the procedure well. The quality of the bowel                         preparation was good except the cecum was poor. Findings:      The perianal and digital rectal examinations were normal.      A 3 mm polyp was found in the descending colon. The polyp was sessile.       The polyp was removed with a cold snare. Resection and retrieval were        complete. Estimated blood loss was minimal.      A 2 mm polyp was found in the sigmoid colon. The polyp was sessile. The       polyp was removed with a jumbo cold forceps. Resection and retrieval       were complete. Estimated blood loss was minimal.      Internal hemorrhoids were found during retroflexion. The hemorrhoids       were Grade I (internal hemorrhoids that do not prolapse).      The exam was otherwise without abnormality on direct and retroflexion       views. Impression:            - One 3 mm polyp in the descending colon, removed with                         a cold snare. Resected and retrieved.                        - One 2 mm polyp in the sigmoid colon, removed with a                         jumbo cold forceps. Resected and retrieved.                        - Internal hemorrhoids.                        - The examination was otherwise normal on direct and                         retroflexion views. Recommendation:        - Discharge patient to home.                        - Resume previous diet.                        - Continue present medications.                        -  Await pathology results.                        - Repeat colonoscopy in 1 year because the bowel                         preparation was suboptimal. Can discuss risks/benefits                         of repeat procedure given age as only small amount of                         cecum was not adequately visualized.                        - Return to referring physician as previously                         scheduled. Procedure Code(s):     --- Professional ---                        [id number], Colonoscopy, flexible; with removal of                         tumor(s), polyp(s), or other lesion(s) by snare                         technique                        45380, 59, Colonoscopy, flexible; with biopsy, single                         or multiple Diagnosis Code(s):     --- Professional ---                         Z86.010, Personal history of colonic polyps                        K63.5, Polyp of colon                        K64.0, First degree hemorrhoids CPT copyright 2019 American [employer]. All rights reserved. The codes documented in this report are preliminary and upon coder review may  be revised to meet current compliance requirements. Andrey Farmer MD, MD 12/05/2020 9:20:08 AM Number of Addenda: 0 Note Initiated On: 12/05/2020 8:50 AM Scope Withdrawal Time: 0 hours 9 minutes 47 seconds  Total Procedure Duration: 0 hours 14 minutes 23 seconds  Estimated Blood Loss:  Estimated blood loss was minimal.      [facility]

## 2020-12-05 NOTE — H&P (Signed)
Outpatient short stay form Pre-procedure 12/05/2020  Lesly Rubenstein, MD  Primary Physician: Adin Hector, MD  Reason for visit:  Surveillance colonoscopy  History of present illness:   75 y/o gentleman with history of hypertension, HFrEF (EF 45%), COPD, and CKD here for surveillance colonoscopy for adenomatous polyp on last colonoscopy done 5 years ago. No significant abdominal surgeries. No blood thinners. No family history of GI malignancies.    Current Facility-Administered Medications:    0.9 %  sodium chloride infusion, , Intravenous, Continuous, Alechia Lezama, Hilton Cork, MD, Last Rate: 20 mL/hr at 12/05/20 0845, Continued from Pre-op at 12/05/20 0845  Medications Prior to Admission  Medication Sig Dispense Refill Last Dose   amLODipine (NORVASC) 10 MG tablet Take 10 mg by mouth daily.   12/04/2020   gabapentin (NEURONTIN) 300 MG capsule Take 300 mg by mouth at bedtime.   12/04/2020   lisinopril (PRINIVIL,ZESTRIL) 40 MG tablet Take 40 mg by mouth daily.   12/04/2020   lovastatin (MEVACOR) 40 MG tablet Take 40 mg by mouth at bedtime.   12/04/2020   metoprolol succinate (TOPROL-XL) 25 MG 24 hr tablet Take 50 mg by mouth daily.    12/04/2020   mupirocin ointment (BACTROBAN) 2 % Apply 1 application topically daily. With dressing changes 22 g 0 12/04/2020   omeprazole (PRILOSEC) 20 MG capsule Take 20 mg by mouth daily.   12/04/2020   potassium chloride SA (K-DUR,KLOR-CON) 20 MEQ tablet Take 20 mEq by mouth.    12/04/2020   spironolactone (ALDACTONE) 25 MG tablet Take 25 mg by mouth daily.   12/04/2020   tiotropium (SPIRIVA) 18 MCG inhalation capsule Place 18 mcg into inhaler and inhale daily.   12/05/2020 at 0700   albuterol (PROVENTIL HFA;VENTOLIN HFA) 108 (90 Base) MCG/ACT inhaler Inhale into the lungs every 4 (four) hours as needed for wheezing or shortness of breath.    at prn     No Known Allergies   Past Medical History:  Diagnosis Date   Arthritis    Asthma    B12  deficiency    Basal cell carcinoma 01/10/2019   mid upper back   CHF (congestive heart failure) (HCC)    chronic systolic CHF   Chronic kidney disease    stage 3   COPD (chronic obstructive pulmonary disease) (HCC)    Degenerative disc disease, lumbar    Diabetes mellitus without complication ([place])    Dysplastic nevus 02/21/2019   right mid back/excision   Dysplastic nevus 07/03/2020   left mid back, severe. Excised 08/27/2020, margins free   Dyspnea    GERD (gastroesophageal reflux disease)    History of Helicobacter pylori infection    HNP (herniated nucleus pulposus)    Hyperaldosteronism (HCC)    Hyperlipidemia    Hypertension    Hypokalemia    Lumbar radiculitis    Lung mass    PAC (premature atrial contraction)    Pure hypercholesterolemia    Thrombocytopenia ([place])     Review of systems:  Otherwise negative.    Physical Exam  Gen: Alert, oriented. Appears stated age.  HEENT: PERRLA. Lungs: No respiratory distress CV: RRR Abd: soft, benign, no masses Ext: No edema    Planned procedures: Proceed with colonoscopy. The patient understands the nature of the planned procedure, indications, risks, alternatives and potential complications including but not limited to bleeding, infection, perforation, damage to internal organs and possible oversedation/side effects from anesthesia. The patient agrees and gives consent to proceed.  Please refer  to procedure notes for findings, recommendations and patient disposition/instructions.     Lesly Rubenstein, MD [facility]

## 2020-12-05 NOTE — Interval H&P Note (Signed)
History and Physical Interval Note:  12/05/2020 8:47 AM  Glenn Welch  has presented today for surgery, with the diagnosis of PERSNAL HISTORY OF COLONIC POLYPS (Z86.010).  The various methods of treatment have been discussed with the patient and family. After consideration of risks, benefits and other options for treatment, the patient has consented to  Procedure(s): COLONOSCOPY WITH PROPOFOL (N/A) as a surgical intervention.  The patient's history has been reviewed, patient examined, no change in status, stable for surgery.  I have reviewed the patient's chart and labs.  Questions were answered to the patient's satisfaction.     Lesly Rubenstein  Ok to proceed with colonoscopy

## 2020-12-05 NOTE — Transfer of Care (Signed)
Immediate Anesthesia Transfer of Care Note  Patient: Glenn Welch  Procedure(s) Performed: COLONOSCOPY WITH PROPOFOL  Patient Location: Endoscopy Unit  Anesthesia Type:General  Level of Consciousness: drowsy  Airway & Oxygen Therapy: Patient Spontanous Breathing  Post-op Assessment: Report given to RN and Post -op Vital signs reviewed and stable  Post vital signs: Reviewed and stable  Last Vitals:  Vitals Value Taken Time  BP 95/59 12/05/20 0914  Temp    Pulse 67 12/05/20 0915  Resp 30 12/05/20 0915  SpO2 98 % 12/05/20 0915  Vitals shown include unvalidated device data.  Last Pain:  Vitals:   12/05/20 0803  TempSrc: Temporal  PainSc: 0-No pain         Complications: No notable events documented.

## 2020-12-05 NOTE — Anesthesia Postprocedure Evaluation (Signed)
Anesthesia Post Note  Patient: Glenn Welch  Procedure(s) Performed: COLONOSCOPY WITH PROPOFOL  Patient location during evaluation: PACU Anesthesia Type: General Level of consciousness: awake and awake and alert Pain management: pain level controlled Vital Signs Assessment: post-procedure vital signs reviewed and stable Respiratory status: spontaneous breathing and respiratory function stable Cardiovascular status: blood pressure returned to baseline Anesthetic complications: no   No notable events documented.   Last Vitals:  Vitals:   12/05/20 0940 12/05/20 0950  BP: 126/77 128/83  Pulse: (!) 58   Resp: 19 15  Temp:    SpO2: 98% 98%    Last Pain:  Vitals:   12/05/20 0910  TempSrc: Temporal  PainSc:                  VAN STAVEREN,Patsy Zaragoza

## 2020-12-05 NOTE — Anesthesia Preprocedure Evaluation (Signed)
Anesthesia Evaluation  Patient identified by MRN, date of birth, ID band Patient awake    Reviewed: Allergy & Precautions, NPO status , Patient's Chart, lab work & pertinent test results  Airway Mallampati: II  TM Distance: >3 FB Neck ROM: full    Dental  (+) Upper Dentures, Edentulous Lower   Pulmonary neg pulmonary ROS, asthma , COPD,  COPD inhaler, former smoker,    Pulmonary exam normal  + decreased breath sounds      Cardiovascular Exercise Tolerance: Good hypertension, Pt. on medications negative cardio ROS Normal cardiovascular exam Rhythm:Regular Rate:Normal     Neuro/Psych negative neurological ROS  negative psych ROS   GI/Hepatic negative GI ROS, Neg liver ROS, GERD  Medicated,  Endo/Other  negative endocrine ROSdiabetes, Type 2  Renal/GU negative Renal ROS  negative genitourinary   Musculoskeletal  (+) Arthritis ,   Abdominal Normal abdominal exam  (+)   Peds negative pediatric ROS (+)  Hematology negative hematology ROS (+)   Anesthesia Other Findings Past Medical History: No date: Arthritis No date: Asthma No date: B12 deficiency 01/10/2019: Basal cell carcinoma     Comment:  mid upper back No date: CHF (congestive heart failure) (HCC)     Comment:  chronic systolic CHF No date: Chronic kidney disease     Comment:  stage 3 No date: COPD (chronic obstructive pulmonary disease) (HCC) No date: Degenerative disc disease, lumbar No date: Diabetes mellitus without complication ([place]) 86/76/7209: Dysplastic nevus     Comment:  right mid back/excision 07/03/2020: Dysplastic nevus     Comment:  left mid back, severe. Excised 08/27/2020, margins free No date: Dyspnea No date: GERD (gastroesophageal reflux disease) No date: History of Helicobacter pylori infection No date: HNP (herniated nucleus pulposus) No date: Hyperaldosteronism (HCC) No date: Hyperlipidemia No date: Hypertension No date:  Hypokalemia No date: Lumbar radiculitis No date: Lung mass No date: PAC (premature atrial contraction) No date: Pure hypercholesterolemia No date: Thrombocytopenia ([place])  Past Surgical History: No date: BRAIN TUMOR EXCISION     Comment:  benign 11/19/2015: COLONOSCOPY WITH PROPOFOL; N/A     Comment:  Procedure: COLONOSCOPY WITH PROPOFOL;  Surgeon: Manya Silvas, MD;  Location: [facility] ENDOSCOPY;  Service:               Endoscopy;  Laterality: N/A; 01/01/2019: ESOPHAGOGASTRODUODENOSCOPY (EGD) WITH PROPOFOL; N/A     Comment:  Procedure: ESOPHAGOGASTRODUODENOSCOPY (EGD) WITH               PROPOFOL;  Surgeon: Toledo, Benay Pike, MD;  Location:               [facility] ENDOSCOPY;  Service: Gastroenterology;  Laterality:               N/A; No date: THORACOTOMY No date: upper lobe of left lung  BMI    Body Mass Index: 23.67 kg/m      Reproductive/Obstetrics negative OB ROS                             Anesthesia Physical Anesthesia Plan  ASA: 3  Anesthesia Plan: General   Post-op Pain Management:    Induction:   PONV Risk Score and Plan: Propofol infusion and TIVA  Airway Management Planned: Nasal Cannula  Additional Equipment:   Intra-op Plan:   Post-operative Plan:   Informed Consent: I have reviewed the patients History  and Physical, chart, labs and discussed the procedure including the risks, benefits and alternatives for the proposed anesthesia with the patient or authorized representative who has indicated his/her understanding and acceptance.     Dental Advisory Given  Plan Discussed with: CRNA and Surgeon  Anesthesia Plan Comments:         Anesthesia Quick Evaluation

## 2020-12-08 ENCOUNTER — Encounter: Payer: Self-pay | Admitting: Gastroenterology

## 2020-12-08 LAB — SURGICAL PATHOLOGY

## 2021-01-07 ENCOUNTER — Ambulatory Visit: Payer: Medicare Other | Admitting: Dermatology

## 2021-01-07 ENCOUNTER — Other Ambulatory Visit: Payer: Self-pay

## 2021-01-07 DIAGNOSIS — Z1283 Encounter for screening for malignant neoplasm of skin: Secondary | ICD-10-CM | POA: Diagnosis not present

## 2021-01-07 DIAGNOSIS — D2239 Melanocytic nevi of other parts of face: Secondary | ICD-10-CM

## 2021-01-07 DIAGNOSIS — D229 Melanocytic nevi, unspecified: Secondary | ICD-10-CM

## 2021-01-07 DIAGNOSIS — D225 Melanocytic nevi of trunk: Secondary | ICD-10-CM

## 2021-01-07 DIAGNOSIS — L578 Other skin changes due to chronic exposure to nonionizing radiation: Secondary | ICD-10-CM

## 2021-01-07 DIAGNOSIS — L821 Other seborrheic keratosis: Secondary | ICD-10-CM

## 2021-01-07 DIAGNOSIS — D1800 Hemangioma unspecified site: Secondary | ICD-10-CM

## 2021-01-07 DIAGNOSIS — Z86018 Personal history of other benign neoplasm: Secondary | ICD-10-CM

## 2021-01-07 DIAGNOSIS — D2371 Other benign neoplasm of skin of right lower limb, including hip: Secondary | ICD-10-CM

## 2021-01-07 DIAGNOSIS — Z85828 Personal history of other malignant neoplasm of skin: Secondary | ICD-10-CM

## 2021-01-07 DIAGNOSIS — L814 Other melanin hyperpigmentation: Secondary | ICD-10-CM

## 2021-01-07 NOTE — Patient Instructions (Addendum)
Recommend taking Heliocare sun protection supplement daily in sunny weather for additional sun protection. For maximum protection on the sunniest days, you can take up to 2 capsules of regular Heliocare OR take 1 capsule of Heliocare Ultra. For prolonged exposure (such as a full day in the sun), you can repeat your dose of the supplement 4 hours after your first dose. Heliocare can be purchased at [facility] or at [email or web address].    Melanoma ABCDEs  Melanoma is the most dangerous type of skin cancer, and is the leading cause of death from skin disease.  You are more likely to develop melanoma if you: Have light-colored skin, light-colored eyes, or red or blond hair Spend a lot of time in the sun Tan regularly, either outdoors or in a tanning bed Have had blistering sunburns, especially during childhood Have a close family member who has had a melanoma Have atypical moles or large birthmarks  Early detection of melanoma is key since treatment is typically straightforward and cure rates are extremely high if we catch it early.   The first sign of melanoma is often a change in a mole or a new dark spot.  The ABCDE system is a way of remembering the signs of melanoma.  A for asymmetry:  The two halves do not match. B for border:  The edges of the growth are irregular. C for color:  A mixture of colors are present instead of an even brown color. D for diameter:  Melanomas are usually (but not always) greater than 69mm - the size of a pencil eraser. E for evolution:  The spot keeps changing in size, shape, and color.  Please check your skin once per month between visits. You can use a small mirror in front and a large mirror behind you to keep an eye on the back side or your body.   If you see any new or changing lesions before your next follow-up, please call to schedule a visit.  Please continue daily skin protection including broad spectrum sunscreen SPF 30+ to sun-exposed  areas, reapplying every 2 hours as needed when you're outdoors.   Staying in the shade or wearing long sleeves, sun glasses (UVA+UVB protection) and wide brim hats (4-inch brim around the entire circumference of the hat) are also recommended for sun protection.    If You Need Anything After Your Visit  If you have any questions or concerns for your doctor, please call our main line at [phone] and press option 4 to reach your doctor's medical assistant. If no one answers, please leave a voicemail as directed and we will return your call as soon as possible. Messages left after 4 pm will be answered the following business day.   You may also send [place] a message via [place]. We typically respond to MyChart messages within 1-2 business days.  For prescription refills, please ask your pharmacy to contact our office. Our fax number is [phone].  If you have an urgent issue when the clinic is closed that cannot wait until the next business day, you can page your doctor at the number below.    Please note that while we do our best to be available for urgent issues outside of office hours, we are not available 24/7.   If you have an urgent issue and are unable to reach [place], you may choose to seek medical care at your doctor's office, retail clinic, urgent care center, or emergency room.  If you have a  medical emergency, please immediately call 911 or go to the emergency department.  Pager Numbers  - Dr. Nehemiah Massed: [phone]  - Dr. Laurence Ferrari: [phone]  - Dr. Nicole Kindred: [phone]  In the event of inclement weather, please call our main line at [phone] for an update on the status of any delays or closures.  Dermatology Medication Tips: Please keep the boxes that topical medications come in in order to help keep track of the instructions about where and how to use these. Pharmacies typically print the medication instructions only on the boxes and not directly on the medication tubes.    If your medication is too expensive, please contact our office at [phone] option 4 or send [place] a message through [place].   We are unable to tell what your co-pay for medications will be in advance as this is different depending on your insurance coverage. However, we may be able to find a substitute medication at lower cost or fill out paperwork to get insurance to cover a needed medication.   If a prior authorization is required to get your medication covered by your insurance company, please allow [place] 1-2 business days to complete this process.  Drug prices often vary depending on where the prescription is filled and some pharmacies may offer cheaper prices.  The website www.goodrx.com contains coupons for medications through different pharmacies. The prices here do not account for what the cost may be with help from insurance (it may be cheaper with your insurance), but the website can give you the price if you did not use any insurance.  - You can print the associated coupon and take it with your prescription to the pharmacy.  - You may also stop by our office during regular business hours and pick up a GoodRx coupon card.  - If you need your prescription sent electronically to a different pharmacy, notify our office through [facility] or by phone at [phone] option 4.     Si Usted Necesita Algo Despus de Su Visita  Tambin puede enviarnos un mensaje a travs de Pharmacist, community. Por lo general respondemos a los mensajes de MyChart en el transcurso de 1 a 2 das hbiles.  Para renovar recetas, por favor pida a su farmacia que se ponga en contacto con nuestra oficina. Harland Dingwall de fax es [place] [phone].  Si tiene un asunto urgente cuando la clnica est cerrada y que no puede esperar hasta el siguiente da hbil, puede llamar/localizar a su doctor(a) al nmero que aparece a continuacin.   Por favor, tenga en cuenta que aunque hacemos todo lo posible para estar  disponibles para asuntos urgentes fuera del horario de [place], no estamos disponibles las 24 horas del da, los 7 das de la [place].   Si tiene un problema urgente y no puede comunicarse con nosotros, puede optar por buscar atencin mdica  en el consultorio de su doctor(a), en una clnica privada, en un centro de atencin urgente o en una sala de emergencias.  Si tiene Engineering geologist, por favor llame inmediatamente al 911 o vaya a la sala de emergencias.  Nmeros de bper  - Dr. Nehemiah Massed: [phone]  - Dra. Moye: [phone]  - Dra. Nicole Kindred: [phone]  En caso de inclemencias del [place], por favor llame a Johnsie Kindred principal al [phone] para una actualizacin sobre el [place] de cualquier retraso o cierre.  Consejos para la medicacin en dermatologa: Por favor, guarde las cajas en las que vienen los medicamentos de uso tpico para ayudarle  a seguir las [employer] dnde y cmo usarlos. Las farmacias generalmente imprimen las instrucciones del medicamento slo en las cajas y no directamente en los tubos del [place].   Si su medicamento es muy caro, por favor, pngase en contacto con Zigmund Daniel llamando al [phone] y presione la opcin 4 o envenos un mensaje a travs de Pharmacist, community.   No podemos decirle cul ser su copago por los medicamentos por adelantado ya que esto es diferente dependiendo de la cobertura de su seguro. Sin embargo, es posible que podamos encontrar un medicamento sustituto a Electrical engineer un formulario para que el seguro cubra el medicamento que se considera necesario.   Si se requiere una autorizacin previa para que su compaa de seguros [place] su medicamento, por favor permtanos de 1 a 2 das hbiles para completar este proceso.  Los precios de los medicamentos varan con frecuencia dependiendo del Environmental consultant de dnde se surte la receta y alguna farmacias pueden ofrecer precios ms baratos.  El sitio web www.goodrx.com tiene  cupones para medicamentos de Airline pilot. Los precios aqu no tienen en cuenta lo que podra costar con la ayuda del seguro (puede ser ms barato con su seguro), pero el sitio web puede darle el precio si no utiliz Research scientist (physical sciences).  - Puede imprimir el cupn correspondiente y llevarlo con su receta a la farmacia.  - Tambin puede pasar por nuestra oficina durante el horario de atencin regular y Charity fundraiser una tarjeta de cupones de GoodRx.  - Si necesita que su receta se enve electrnicamente a una farmacia diferente, informe a nuestra oficina a travs de MyChart de [place] o por telfono llamando al [phone] y presione la opcin 4.

## 2021-01-07 NOTE — Progress Notes (Signed)
Follow-Up Visit   Subjective  Glenn Welch is a 75 y.o. male who presents for the following: Follow-up (Patient here today for 6 month tbse. Patient has history of bcc and several dysplastic moles that have been excised. ).  Patient here for full body skin exam and skin cancer screening.  The following portions of the chart were reviewed this encounter and updated as appropriate:  Tobacco   Allergies   Meds   Problems   Med Hx   Surg Hx   Fam Hx       Review of Systems: No other skin or systemic complaints except as noted in HPI or Assessment and Plan.   Objective  Well appearing patient in no apparent distress; mood and affect are within normal limits.  A full examination was performed including scalp, head, eyes, ears, nose, lips, neck, chest, axillae, abdomen, back, buttocks, bilateral upper extremities, bilateral lower extremities, hands, feet, fingers, toes, fingernails, and toenails. All findings within normal limits unless otherwise noted below.  left suprapubic 0.8 cm medium brown very thin papule   adjacent to left lateral brow 0.9 cm medium slightly brown stable when compared to prior photo      Assessment & Plan  Nevus (2) left suprapubic; adjacent to left lateral brow  Will monitor  Stable  Benign-appearing.  Observation.  Call clinic for new or changing lesions.  Recommend daily use of broad spectrum spf 30+ sunscreen to sun-exposed areas.    Lentigines - Scattered tan macules - Due to sun exposure - Benign-appearing, observe - Recommend daily broad spectrum sunscreen SPF 30+ to sun-exposed areas, reapply every 2 hours as needed. - Call for any changes  Seborrheic Keratoses - Stuck-on, waxy, tan-brown papules and/or plaques  - Benign-appearing - Discussed benign etiology and prognosis. - Observe - Call for any changes  Dermatofibroma - Firm pink/brown papulenodule with dimple sign right posterior thigh - Benign appearing - Call for any  changes  Melanocytic Nevi - Tan-brown and/or pink-flesh-colored symmetric macules and papules - Benign appearing on exam today - Observation - Call clinic for new or changing moles - Recommend daily use of broad spectrum spf 30+ sunscreen to sun-exposed areas.   Hemangiomas - Red papules - Discussed benign nature - Observe - Call for any changes  Actinic Damage - Chronic condition, secondary to cumulative UV/sun exposure - diffuse scaly erythematous macules with underlying dyspigmentation - Recommend daily broad spectrum sunscreen SPF 30+ to sun-exposed areas, reapply every 2 hours as needed.  - Staying in the shade or wearing long sleeves, sun glasses (UVA+UVB protection) and wide brim hats (4-inch brim around the entire circumference of the hat) are also recommended for sun protection.  - Call for new or changing lesions.  History of Basal Cell Carcinoma of the Skin - No evidence of recurrence today at mid upper back (2020) - Recommend regular full body skin exams - Recommend daily broad spectrum sunscreen SPF 30+ to sun-exposed areas, reapply every 2 hours as needed.  - Call if any new or changing lesions are noted between office visits  History of Dysplastic Nevi - No evidence of recurrence today Left mid back and right mid back  - Recommend regular full body skin exams - Recommend daily broad spectrum sunscreen SPF 30+ to sun-exposed areas, reapply every 2 hours as needed.  - Call if any new or changing lesions are noted between office visits  Skin cancer screening performed today.  Return for 6 month - 1 year tbse  history of bcc . I, Ruthell Rummage, CMA, am acting as scribe for Forest Gleason, MD.  Documentation: I have reviewed the above documentation for accuracy and completeness, and I agree with the above.  Forest Gleason, MD

## 2021-01-13 ENCOUNTER — Encounter: Payer: Self-pay | Admitting: Dermatology

## 2021-04-29 ENCOUNTER — Encounter: Payer: Self-pay | Admitting: Ophthalmology

## 2021-05-04 NOTE — Discharge Instructions (Signed)

## 2021-05-05 ENCOUNTER — Ambulatory Visit: Payer: Medicare Other | Admitting: Anesthesiology

## 2021-05-05 ENCOUNTER — Other Ambulatory Visit: Payer: Self-pay

## 2021-05-05 ENCOUNTER — Encounter
Admission: RE | Disposition: A | Discharge status: Home / Self Care | Payer: Self-pay | Source: Ambulatory Visit | Attending: Ophthalmology

## 2021-05-05 ENCOUNTER — Encounter: Payer: Self-pay | Admitting: Ophthalmology

## 2021-05-05 ENCOUNTER — Ambulatory Visit
Admission: RE | Admit: 2021-05-05 | Discharge: 2021-05-05 | Disposition: A | Discharge status: Home / Self Care | Payer: Medicare Other | Source: Ambulatory Visit | Attending: Ophthalmology | Admitting: Ophthalmology

## 2021-05-05 DIAGNOSIS — E1122 Type 2 diabetes mellitus with diabetic chronic kidney disease: Secondary | ICD-10-CM | POA: Insufficient documentation

## 2021-05-05 DIAGNOSIS — J449 Chronic obstructive pulmonary disease, unspecified: Secondary | ICD-10-CM | POA: Insufficient documentation

## 2021-05-05 DIAGNOSIS — K219 Gastro-esophageal reflux disease without esophagitis: Secondary | ICD-10-CM | POA: Diagnosis not present

## 2021-05-05 DIAGNOSIS — N183 Chronic kidney disease, stage 3 unspecified: Secondary | ICD-10-CM | POA: Insufficient documentation

## 2021-05-05 DIAGNOSIS — E1136 Type 2 diabetes mellitus with diabetic cataract: Secondary | ICD-10-CM | POA: Insufficient documentation

## 2021-05-05 DIAGNOSIS — H2512 Age-related nuclear cataract, left eye: Secondary | ICD-10-CM | POA: Diagnosis not present

## 2021-05-05 DIAGNOSIS — I130 Hypertensive heart and chronic kidney disease with heart failure and stage 1 through stage 4 chronic kidney disease, or unspecified chronic kidney disease: Secondary | ICD-10-CM | POA: Diagnosis not present

## 2021-05-05 DIAGNOSIS — Z87891 Personal history of nicotine dependence: Secondary | ICD-10-CM | POA: Diagnosis not present

## 2021-05-05 DIAGNOSIS — I5022 Chronic systolic (congestive) heart failure: Secondary | ICD-10-CM | POA: Insufficient documentation

## 2021-05-05 DIAGNOSIS — N189 Chronic kidney disease, unspecified: Secondary | ICD-10-CM | POA: Diagnosis not present

## 2021-05-05 HISTORY — PX: CATARACT EXTRACTION W/PHACO: SHX586

## 2021-05-05 HISTORY — DX: Unspecified convulsions: R56.9

## 2021-05-05 HISTORY — DX: Presence of dental prosthetic device (complete) (partial): Z97.2

## 2021-05-05 SURGERY — PHACOEMULSIFICATION, CATARACT, WITH IOL INSERTION
Anesthesia: Monitor Anesthesia Care | Site: Eye | Laterality: Left

## 2021-05-05 MED ORDER — SIGHTPATH DOSE#1 BSS IO SOLN
INTRAOCULAR | Status: DC | PRN
  Administered 2021-05-05: 1 mL via INTRAMUSCULAR

## 2021-05-05 MED ORDER — SIGHTPATH DOSE#1 BSS IO SOLN
INTRAOCULAR | Status: DC | PRN
  Administered 2021-05-05: 15 mL

## 2021-05-05 MED ORDER — LACTATED RINGERS IV SOLN: INTRAVENOUS | Status: DC

## 2021-05-05 MED ORDER — SIGHTPATH DOSE#1 BSS IO SOLN
INTRAOCULAR | Status: DC | PRN
Start: 2021-05-05 — End: 2021-05-05
  Administered 2021-05-05: 61 mL via OPHTHALMIC

## 2021-05-05 MED ORDER — TETRACAINE HCL 0.5 % OP SOLN
1.0000 [drp] | OPHTHALMIC | Status: DC | PRN
  Administered 2021-05-05 (×3): 1 [drp] via OPHTHALMIC

## 2021-05-05 MED ORDER — MIDAZOLAM HCL 2 MG/2ML IJ SOLN
INTRAMUSCULAR | Status: DC | PRN
  Administered 2021-05-05: 1 mg via INTRAVENOUS

## 2021-05-05 MED ORDER — ARMC OPHTHALMIC DILATING DROPS
1.0000 "application " | OPHTHALMIC | Status: DC | PRN
  Administered 2021-05-05 (×3): 1 via OPHTHALMIC

## 2021-05-05 MED ORDER — BRIMONIDINE TARTRATE-TIMOLOL 0.2-0.5 % OP SOLN
OPHTHALMIC | Status: DC | PRN
  Administered 2021-05-05: 1 [drp] via OPHTHALMIC

## 2021-05-05 MED ORDER — FENTANYL CITRATE (PF) 100 MCG/2ML IJ SOLN
INTRAMUSCULAR | Status: DC | PRN
Start: 2021-05-05 — End: 2021-05-05
  Administered 2021-05-05: 50 ug via INTRAVENOUS

## 2021-05-05 MED ORDER — SIGHTPATH DOSE#1 NA CHONDROIT SULF-NA HYALURON 40-17 MG/ML IO SOLN
INTRAOCULAR | Status: DC | PRN
  Administered 2021-05-05: 1 mL via INTRAOCULAR

## 2021-05-05 MED ORDER — MOXIFLOXACIN HCL 0.5 % OP SOLN
OPHTHALMIC | Status: DC | PRN
  Administered 2021-05-05: 0.2 mL via OPHTHALMIC

## 2021-05-05 SURGICAL SUPPLY — 10 items
CATARACT SUITE SIGHTPATH (MISCELLANEOUS) ×2 IMPLANT
FEE CATARACT SUITE SIGHTPATH (MISCELLANEOUS) ×1 IMPLANT
GLOVE SURG ENC TEXT LTX SZ8 (GLOVE) ×2 IMPLANT
GLOVE SURG TRIUMPH 8.0 PF LTX (GLOVE) ×2 IMPLANT
LENS IOL TECNIS EYHANCE 24.5 (Intraocular Lens) ×1 IMPLANT
NDL FILTER BLUNT 18X1 1/2 (NEEDLE) ×1 IMPLANT
NEEDLE FILTER BLUNT 18X 1/2SAF (NEEDLE) ×1
NEEDLE FILTER BLUNT 18X1 1/2 (NEEDLE) ×1 IMPLANT
SYR 3ML LL SCALE MARK (SYRINGE) ×2 IMPLANT
WATER STERILE IRR 250ML POUR (IV SOLUTION) ×2 IMPLANT

## 2021-05-05 NOTE — Anesthesia Preprocedure Evaluation (Signed)
Anesthesia Evaluation  ?Patient identified by MRN, date of birth, ID band ?Patient awake ? ? ? ?Reviewed: ?Allergy & Precautions, H&P , NPO status , Patient's Chart, lab work & pertinent test results ? ?Airway ?Mallampati: II ? ?TM Distance: >3 FB ?Neck ROM: full ? ? ? Dental ? ?(+) Edentulous Lower, Edentulous Upper ?  ?Pulmonary ?COPD,  COPD inhaler, former smoker,  ?  ?Pulmonary exam normal ?breath sounds clear to auscultation ? ? ? ? ? ? Cardiovascular ?hypertension, +CHF  ?Normal cardiovascular exam ?Rhythm:regular Rate:Normal ? ? ?  ?Neuro/Psych ?  ? GI/Hepatic ?GERD  ,  ?Endo/Other  ?diabetes ? Renal/GU ?Renal disease  ? ?  ?Musculoskeletal ? ? Abdominal ?  ?Peds ? Hematology ?  ?Anesthesia Other Findings ? ? Reproductive/Obstetrics ? ?  ? ? ? ? ? ? ? ? ? ? ? ? ? ?  ?  ? ? ? ? ? ? ? ? ?Anesthesia Physical ?Anesthesia Plan ? ?ASA: 3 ? ?Anesthesia Plan: MAC  ? ?Post-op Pain Management: Minimal or no pain anticipated  ? ?Induction:  ? ?PONV Risk Score and Plan: 1 and Treatment may vary due to age or medical condition, TIVA and Midazolam ? ?Airway Management Planned:  ? ?Additional Equipment:  ? ?Intra-op Plan:  ? ?Post-operative Plan:  ? ?Informed Consent: I have reviewed the patients History and Physical, chart, labs and discussed the procedure including the risks, benefits and alternatives for the proposed anesthesia with the patient or authorized representative who has indicated his/her understanding and acceptance.  ? ? ? ?Dental Advisory Given ? ?Plan Discussed with: CRNA ? ?Anesthesia Plan Comments:   ? ? ? ? ? ? ?Anesthesia Quick Evaluation ? ?

## 2021-05-05 NOTE — Op Note (Signed)
PREOPERATIVE DIAGNOSIS:  Nuclear sclerotic cataract of the left eye. ?  ?POSTOPERATIVE DIAGNOSIS:  Nuclear sclerotic cataract of the left eye. ?  ?OPERATIVE PROCEDURE:ORPROCALL@ ?  ?SURGEON:  Birder Robson, MD. ?  ?ANESTHESIA: ? ?Anesthesiologist: Ronelle Nigh, MD ?CRNA: Silvana Newness, CRNA ? ?1.      Managed anesthesia care. ?2.     0.20m of Shugarcaine was instilled following the paracentesis ?  ?COMPLICATIONS:  None. ?  ?TECHNIQUE:   Stop and chop ?  ?DESCRIPTION OF PROCEDURE:  The patient was examined and consented in the preoperative holding area where the aforementioned topical anesthesia was applied to the left eye and then brought back to the Operating Room where the left eye was prepped and draped in the usual sterile ophthalmic fashion and a lid speculum was placed. A paracentesis was created with the side port blade and the anterior chamber was filled with viscoelastic. A near clear corneal incision was performed with the steel keratome. A continuous curvilinear capsulorrhexis was performed with a cystotome followed by the capsulorrhexis forceps. Hydrodissection and hydrodelineation were carried out with BSS on a blunt cannula. The lens was removed in a stop and chop  technique and the remaining cortical material was removed with the irrigation-aspiration handpiece. The capsular bag was inflated with viscoelastic and the Technis [id number] lens was placed in the capsular bag without complication. The remaining viscoelastic was removed from the eye with the irrigation-aspiration handpiece. The wounds were hydrated. The anterior chamber was flushed with BSS and the eye was inflated to physiologic pressure. 0.12mVigamox was placed in the anterior chamber. The wounds were found to be water tight. The eye was dressed with Combigan. The patient was given protective glasses to wear throughout the day and a shield with which to sleep tonight. The patient was also given drops with which to begin a drop regimen  today and will follow-up with me in one day. ?Implant Name Type Inv. Item Serial No. Manufacturer Lot No. LRB No. Used Action  ?LENS IOL TECNIS EYHANCE 24.5 - [id number] Lens LENS IOL TECNIS EYHANCE 24.5 [id number]  Left 1 Implanted  ?  ?Procedure(s): ?CATARACT EXTRACTION PHACO AND INTRAOCULAR LENS PLACEMENT (IOC) LEFT DIABETIC 15.07 01:15.7 (Left) ? ?Electronically signed: WiBirder Robson/11/2021 7:44 AM ? ?

## 2021-05-05 NOTE — H&P (Signed)
[place]  ? ?Primary Care Physician:  Adin Hector, MD ?Ophthalmologist: Dr. Stoney Bang ? ?Pre-Procedure History & Physical: ?HPI:  Glenn Welch is a 76 y.o. male here for cataract surgery. ?  ?Past Medical History:  ?Diagnosis Date  ? Arthritis   ? Asthma   ? B12 deficiency   ? Basal cell carcinoma 01/10/2019  ? mid upper back  ? CHF (congestive heart failure) ([place])   ? chronic systolic CHF  ? Chronic kidney disease   ? stage 3  ? COPD (chronic obstructive pulmonary disease) ([place])   ? Degenerative disc disease, lumbar   ? Diabetes mellitus without complication ([place])   ? Dysplastic nevus 02/21/2019  ? right mid back/excision  ? Dysplastic nevus 07/03/2020  ? left mid back, severe. Excised 08/27/2020, margins free  ? Dyspnea   ? GERD (gastroesophageal reflux disease)   ? History of Helicobacter pylori infection   ? HNP (herniated nucleus pulposus)   ? Hyperaldosteronism ([place])   ? Hyperlipidemia   ? Hypertension   ? Hypokalemia   ? Lumbar radiculitis   ? Lung mass   ? PAC (premature atrial contraction)   ? Pure hypercholesterolemia   ? Seizure ([place])   ? x1.  With brain tumor 1995 or 1996  ? Thrombocytopenia ([place])   ? Wears dentures   ? full upper and lower  ? ? ?Past Surgical History:  ?Procedure Laterality Date  ? BRAIN TUMOR EXCISION    ? benign  ? COLONOSCOPY WITH PROPOFOL N/A 11/19/2015  ? Procedure: COLONOSCOPY WITH PROPOFOL;  Surgeon: Manya Silvas, MD;  Location: [facility] ENDOSCOPY;  Service: Endoscopy;  Laterality: N/A;  ? COLONOSCOPY WITH PROPOFOL N/A 12/05/2020  ? Procedure: COLONOSCOPY WITH PROPOFOL;  Surgeon: Lesly Rubenstein, MD;  Location[facility] ENDOSCOPY;  Service: Endoscopy;  Laterality: N/A;  ? ESOPHAGOGASTRODUODENOSCOPY (EGD) WITH PROPOFOL N/A 01/01/2019  ? Procedure: ESOPHAGOGASTRODUODENOSCOPY (EGD) WITH PROPOFOL;  Surgeon: Toledo, Benay Pike, MD;  Location: [facility] ENDOSCOPY;  Service: Gastroenterology;  Laterality: N/A;  ? THORACOTOMY    ? upper lobe of left lung    ? ? ?Prior to  Admission medications   ?Medication Sig Start Date End Date Taking? Authorizing Provider  ?albuterol (PROVENTIL HFA;VENTOLIN HFA) 108 (90 Base) MCG/ACT inhaler Inhale into the lungs every 4 (four) hours as needed for wheezing or shortness of breath.   Yes [provider]  ?amLODipine (NORVASC) 10 MG tablet Take 10 mg by mouth daily.   Yes [provider]  ?gabapentin (NEURONTIN) 300 MG capsule Take 300 mg by mouth at bedtime.   Yes [provider]  ?lisinopril (PRINIVIL,ZESTRIL) 40 MG tablet Take 40 mg by mouth daily.   Yes [provider]  ?lovastatin (MEVACOR) 40 MG tablet Take 40 mg by mouth at bedtime.   Yes [provider]  ?metoprolol succinate (TOPROL-XL) 25 MG 24 hr tablet Take 50 mg by mouth daily.    Yes [provider]  ?omeprazole (PRILOSEC) 20 MG capsule Take 20 mg by mouth daily.   Yes [provider]  ?potassium chloride SA (K-DUR,KLOR-CON) 20 MEQ tablet Take 20 mEq by mouth.    Yes [provider]  ?spironolactone (ALDACTONE) 25 MG tablet Take 25 mg by mouth daily.   Yes [provider]  ?tiotropium (SPIRIVA) 18 MCG inhalation capsule Place 18 mcg into inhaler and inhale daily.   Yes [provider]  ?mupirocin ointment (BACTROBAN) 2 % Apply 1 application topically daily. With dressing changes 08/27/20  Laurence Ferrari, [place], MD  ? ? ?Allergies as of 04/01/2021  ? (No Known Allergies)  ? ? ?History reviewed. No pertinent family history. ? ?Social History  ? ?Socioeconomic History  ? Marital status: Single  ?  Spouse name: Not on file  ? Number of children: Not on file  ? Years of education: Not on file  ? Highest education level: Not on file  ?Occupational History  ? Not on file  ?Tobacco Use  ? Smoking status: Former  ?  Types: Cigarettes  ?  Quit date: 2005  ?  Years since quitting: 18.2  ? Smokeless tobacco: Never  ?Vaping Use  ? Vaping Use: Never used  ?Substance and Sexual Activity  ? Alcohol use: Yes  ?  Comment:  occ  ? Drug use: Never  ? Sexual activity: Not on file  ?Other Topics Concern  ? Not on file  ?Social History Narrative  ? Not on file  ? ?Social Determinants of Health  ? ?Financial Resource Strain: Not on file  ?Food Insecurity: Not on file  ?Transportation Needs: Not on file  ?Physical Activity: Not on file  ?Stress: Not on file  ?Social Connections: Not on file  ?Intimate Partner Violence: Not on file  ? ? ?Review of Systems: ?See HPI, otherwise negative ROS ? ?Physical Exam: ?BP (!) 181/76   Pulse (!) 59   Temp 97.6 ?F (36.4 ?C)   Ht '5\' 9"'$  (1.753 m)   Wt 72.6 kg   SpO2 96%   BMI 23.63 kg/m?  ?General:   Alert, cooperative in NAD ?Head:  Normocephalic and atraumatic. ?Respiratory:  Normal work of breathing. ?Cardiovascular:  RRR ? ?Impression/Plan: ?Glenn Welch is here for cataract surgery. ? ?Risks, benefits, limitations, and alternatives regarding cataract surgery have been reviewed with the patient.  Questions have been answered.  All parties agreeable. ? ? ?Birder Robson, MD  05/05/2021, 7:15 AM ? ? ?

## 2021-05-05 NOTE — Transfer of Care (Signed)
Immediate Anesthesia Transfer of Care Note ? ?Patient: Glenn Welch ? ?Procedure(s) Performed: CATARACT EXTRACTION PHACO AND INTRAOCULAR LENS PLACEMENT (IOC) LEFT DIABETIC 15.07 01:15.7 (Left: Eye) ? ?Patient Location: PACU ? ?Anesthesia Type: MAC ? ?Level of Consciousness: awake, alert  and patient cooperative ? ?Airway and Oxygen Therapy: Patient Spontanous Breathing and Patient connected to supplemental oxygen ? ?Post-op Assessment: Post-op Vital signs reviewed, Patient's Cardiovascular Status Stable, Respiratory Function Stable, Patent Airway and No signs of Nausea or vomiting ? ?Post-op Vital Signs: Reviewed and stable ? ?Complications: No notable events documented. ? ?

## 2021-05-05 NOTE — Anesthesia Postprocedure Evaluation (Signed)
Anesthesia Post Note ? ?Patient: Glenn Welch ? ?Procedure(s) Performed: CATARACT EXTRACTION PHACO AND INTRAOCULAR LENS PLACEMENT (IOC) LEFT DIABETIC 15.07 01:15.7 (Left: Eye) ? ? ?  ?Patient location during evaluation: PACU ?Anesthesia Type: MAC ?Level of consciousness: awake and alert and oriented ?Pain management: satisfactory to patient ?Vital Signs Assessment: post-procedure vital signs reviewed and stable ?Respiratory status: spontaneous breathing, nonlabored ventilation and respiratory function stable ?Cardiovascular status: blood pressure returned to baseline and stable ?Postop Assessment: Adequate PO intake and No signs of nausea or vomiting ?Anesthetic complications: no ? ? ?No notable events documented. ? ?Raliegh Ip ? ? ? ? ? ?

## 2021-05-06 ENCOUNTER — Encounter: Payer: Self-pay | Admitting: Ophthalmology

## 2021-07-30 ENCOUNTER — Other Ambulatory Visit: Payer: Self-pay

## 2021-07-30 ENCOUNTER — Emergency Department: Payer: Medicare Other

## 2021-07-30 ENCOUNTER — Emergency Department
Admission: EM | Admit: 2021-07-30 | Discharge: 2021-07-30 | Disposition: A | Discharge status: Home / Self Care | Payer: Medicare Other | Attending: Emergency Medicine | Admitting: Emergency Medicine

## 2021-07-30 DIAGNOSIS — Z20822 Contact with and (suspected) exposure to covid-19: Secondary | ICD-10-CM | POA: Diagnosis not present

## 2021-07-30 DIAGNOSIS — J189 Pneumonia, unspecified organism: Secondary | ICD-10-CM | POA: Insufficient documentation

## 2021-07-30 DIAGNOSIS — R0602 Shortness of breath: Secondary | ICD-10-CM | POA: Diagnosis present

## 2021-07-30 DIAGNOSIS — J449 Chronic obstructive pulmonary disease, unspecified: Secondary | ICD-10-CM | POA: Diagnosis not present

## 2021-07-30 LAB — TROPONIN I (HIGH SENSITIVITY)
Troponin I (High Sensitivity): 19 ng/L — ABNORMAL HIGH (ref <18)
Troponin I (High Sensitivity): 21 ng/L — ABNORMAL HIGH (ref <18)

## 2021-07-30 LAB — CBC WITH DIFFERENTIAL/PLATELET
Abs Immature Granulocytes: 0.04 10*3/uL (ref 0.00–0.07)
Basophils Absolute: 0.1 10*3/uL (ref 0.0–0.1)
Basophils Relative: 1 %
Eosinophils Absolute: 0 10*3/uL (ref 0.0–0.5)
Eosinophils Relative: 0 %
HCT: 49.8 % (ref 39.0–52.0)
Hemoglobin: 17 g/dL (ref 13.0–17.0)
Immature Granulocytes: 0 %
Lymphocytes Relative: 6 %
Lymphs Abs: 0.6 10*3/uL — ABNORMAL LOW (ref 0.7–4.0)
MCH: 30.9 pg (ref 26.0–34.0)
MCHC: 34.1 g/dL (ref 30.0–36.0)
MCV: 90.4 fL (ref 80.0–100.0)
Monocytes Absolute: 0.9 10*3/uL (ref 0.1–1.0)
Monocytes Relative: 9 %
Neutro Abs: 8.9 10*3/uL — ABNORMAL HIGH (ref 1.7–7.7)
Neutrophils Relative %: 84 %
Platelets: 159 10*3/uL (ref 150–400)
RBC: 5.51 MIL/uL (ref 4.22–5.81)
RDW: 13.2 % (ref 11.5–15.5)
WBC: 10.5 10*3/uL (ref 4.0–10.5)
nRBC: 0 % (ref 0.0–0.2)

## 2021-07-30 LAB — COMPREHENSIVE METABOLIC PANEL
ALT: 18 U/L (ref 0–44)
AST: 31 U/L (ref 15–41)
Albumin: 4 g/dL (ref 3.5–5.0)
Alkaline Phosphatase: 68 U/L (ref 38–126)
Anion gap: 11 (ref 5–15)
BUN: 10 mg/dL (ref 8–23)
CO2: 21 mmol/L — ABNORMAL LOW (ref 22–32)
Calcium: 8.8 mg/dL — ABNORMAL LOW (ref 8.9–10.3)
Chloride: 105 mmol/L (ref 98–111)
Creatinine, Ser: 1.14 mg/dL (ref 0.61–1.24)
GFR, Estimated: >60 mL/min (ref >60)
Glucose, Bld: 161 mg/dL — ABNORMAL HIGH (ref 70–99)
Potassium: 3.4 mmol/L — ABNORMAL LOW (ref 3.5–5.1)
Sodium: 137 mmol/L (ref 135–145)
Total Bilirubin: 1 mg/dL (ref 0.3–1.2)
Total Protein: 7.1 g/dL (ref 6.5–8.1)

## 2021-07-30 LAB — PROCALCITONIN: Procalcitonin: 0.88 ng/mL

## 2021-07-30 LAB — SARS CORONAVIRUS 2 BY RT PCR: SARS Coronavirus 2 by RT PCR: NEGATIVE

## 2021-07-30 MED ORDER — SODIUM CHLORIDE 0.9 % IV SOLN
1.0000 g | Freq: Once | INTRAVENOUS | Status: AC
  Administered 2021-07-30: 1 g via INTRAVENOUS
  Filled 2021-07-30: qty 10

## 2021-07-30 MED ORDER — AZITHROMYCIN 500 MG PO TABS
500.0000 mg | ORAL_TABLET | Freq: Once | ORAL | Status: AC
  Administered 2021-07-30: 500 mg via ORAL
  Filled 2021-07-30: qty 1

## 2021-07-30 MED ORDER — AZITHROMYCIN 250 MG PO TABS: ORAL_TABLET | ORAL | 0 refills | Status: AC

## 2021-07-30 MED ORDER — CEPHALEXIN 500 MG PO CAPS: 500.0000 mg | ORAL_CAPSULE | Freq: Four times a day (QID) | ORAL | 0 refills | Status: AC

## 2021-07-30 MED ORDER — IPRATROPIUM-ALBUTEROL 0.5-2.5 (3) MG/3ML IN SOLN
3.0000 mL | Freq: Once | RESPIRATORY_TRACT | Status: AC
  Administered 2021-07-30: 3 mL via RESPIRATORY_TRACT
  Filled 2021-07-30: qty 3

## 2021-07-30 NOTE — ED Triage Notes (Signed)
Pt c/o dry cough and SOB that started last night with body aches, pt has a hx of COPD, used his inhaler today. Pt is able to speak in complete sentences on arrival

## 2021-07-30 NOTE — Discharge Instructions (Addendum)
You have a left-sided pneumonia.  Your lab work looks good though and your not requiring any extra oxygen.  Computer analysis of your information seems like you will need antibiotics but not hospital admission.  I will give you both Zithromax and Z-Pak and Keflex antibiotics that should cover your pneumonia very well.  Please do not hesitate to return for high fever or shortness of breath that worsens or feeling sicker.  Please follow-up with your regular doctor sometime in the next 2 to 3 days.

## 2021-07-30 NOTE — ED Provider Notes (Signed)
[facility] Provider Note    Event Date/Time   First MD Initiated Contact with Patient 07/30/21 [id number]     (approximate)   History   Shortness of Breath   HPI  Glenn Welch is a 76 y.o. male with a history of COPD who reports increased coughing especially when he laid down last night.  He is not coughing anything up.  He is not running a fever.  He is more short of breath since he has been coughing.  He is not having any pleuritic chest pain.     Physical Exam   Triage Vital Signs: ED Triage Vitals  Enc Vitals Group     BP 07/30/21 0905 (!) 144/78     Pulse Rate 07/30/21 0905 76     Resp 07/30/21 0905 19     Temp 07/30/21 0905 98.1 F (36.7 C)     Temp Source 07/30/21 0905 Oral     SpO2 07/30/21 0905 94 %     Weight --      Height --      Head Circumference --      Peak Flow --      Pain Score 07/30/21 0900 3     Pain Loc --      Pain Edu? --      Excl. in [place]? --     Most recent vital signs: Vitals:   07/30/21 1100 07/30/21 1200  BP: 103/86 136/68  Pulse: 81 77  Resp: 17 (!) 26  Temp:    SpO2: 96% 94%    General: Awake, no distress.  CV:  Good peripheral perfusion.  Heart regular rate and rhythm no audible murmurs Resp:  Normal effort.  Lungs are clear but seems slightly decreased on the left. Abd:  No distention.  Soft nontender Extremities slight trace edema bilaterally   ED Results / Procedures / Treatments   Labs (all labs ordered are listed, but only abnormal results are displayed) Labs Reviewed  COMPREHENSIVE METABOLIC PANEL - Abnormal; Notable for the following components:      Result Value   Potassium 3.4 (*)    CO2 21 (*)    Glucose, Bld 161 (*)    Calcium 8.8 (*)    All other components within normal limits  CBC WITH DIFFERENTIAL/PLATELET - Abnormal; Notable for the following components:   Neutro Abs 8.9 (*)    Lymphs Abs 0.6 (*)    All other components within normal limits  TROPONIN I (HIGH SENSITIVITY) -  Abnormal; Notable for the following components:   Troponin I (High Sensitivity) 19 (*)    All other components within normal limits  TROPONIN I (HIGH SENSITIVITY) - Abnormal; Notable for the following components:   Troponin I (High Sensitivity) 21 (*)    All other components within normal limits  SARS CORONAVIRUS 2 BY RT PCR  PROCALCITONIN     EKG  EKG read and interpreted by me shows normal sinus rhythm at 93 with some PACs.  Left axis intraventricular conduction delay QTc was 505 ms QRS was 125 ms this is similar to EKG from 01 January 2019 QRS then was 130 ms and QTc was 495 ms.   RADIOLOGY Chest x-ray read by radiology reviewed and interpreted by me shows left lung with extensive patchy infiltrate.  Consistent with pneumonia.   PROCEDURES:  Critical Care performed:   Procedures   MEDICATIONS ORDERED IN ED: Medications  ipratropium-albuterol (DUONEB) 0.5-2.5 (3) MG/3ML nebulizer solution 3 mL (  has no administration in time range)  ipratropium-albuterol (DUONEB) 0.5-2.5 (3) MG/3ML nebulizer solution 3 mL (3 mLs Nebulization Given 07/30/21 0938)     IMPRESSION / MDM / ASSESSMENT AND PLAN / ED COURSE  I reviewed the triage vital signs and the nursing notes. Patient's curb score is 1.  This indicates low mortality.  Patient is not hypoxic his lab work looks okay.  I think we can probably let him go.  I will give him Keflex and Zithromax in case of a atypical pneumonia.  This should cover him very well.  I will have him return if worse and follow-up with his doctor.    None have been seen the patient is on the cardiac monitor to evaluate for evidence of arrhythmia and/or significant heart rate changes.  I did consider admission for this patient but it looks like he is healthy enough that we will not have to admit him but he will need close follow-up.   FINAL CLINICAL IMPRESSION(S) / ED DIAGNOSES   Final diagnoses:  Community acquired pneumonia of left lung, unspecified  part of lung     Rx / DC Orders   ED Discharge Orders          Ordered    azithromycin (ZITHROMAX Z-PAK) 250 MG tablet        07/30/21 1221    cephALEXin (KEFLEX) 500 MG capsule  4 times daily        07/30/21 1221             Note:  This document was prepared using Dragon voice recognition software and may include unintentional dictation errors.   Nena Polio, MD 07/30/21 [phone]

## 2021-07-30 NOTE — ED Notes (Signed)
Patient ambulated in room. O2 sats stayed between 90-96%. Patient stating SOB and tachypnea on exertion.

## 2021-10-08 ENCOUNTER — Ambulatory Visit: Payer: Medicare Other | Admitting: Dermatology

## 2021-10-08 DIAGNOSIS — D1801 Hemangioma of skin and subcutaneous tissue: Secondary | ICD-10-CM

## 2021-10-08 DIAGNOSIS — Z86018 Personal history of other benign neoplasm: Secondary | ICD-10-CM | POA: Diagnosis not present

## 2021-10-08 DIAGNOSIS — Z1283 Encounter for screening for malignant neoplasm of skin: Secondary | ICD-10-CM

## 2021-10-08 DIAGNOSIS — L821 Other seborrheic keratosis: Secondary | ICD-10-CM

## 2021-10-08 DIAGNOSIS — D225 Melanocytic nevi of trunk: Secondary | ICD-10-CM

## 2021-10-08 DIAGNOSIS — Z85828 Personal history of other malignant neoplasm of skin: Secondary | ICD-10-CM

## 2021-10-08 DIAGNOSIS — D485 Neoplasm of uncertain behavior of skin: Secondary | ICD-10-CM

## 2021-10-08 DIAGNOSIS — D492 Neoplasm of unspecified behavior of bone, soft tissue, and skin: Secondary | ICD-10-CM

## 2021-10-08 DIAGNOSIS — D229 Melanocytic nevi, unspecified: Secondary | ICD-10-CM

## 2021-10-08 DIAGNOSIS — L578 Other skin changes due to chronic exposure to nonionizing radiation: Secondary | ICD-10-CM

## 2021-10-08 NOTE — Progress Notes (Signed)
Follow-Up Visit   Subjective  Glenn Welch is a 76 y.o. male who presents for the following: TBSE (The patient presents for Total-Body Skin Exam (TBSE) for skin cancer screening and mole check.  The patient has spots, moles and lesions to be evaluated, some may be new or changing and the patient has concerns that these could be cancer. Patient with hx of BCC and dysplastic nevi. /).   The following portions of the chart were reviewed this encounter and updated as appropriate:   Tobacco  Allergies  Meds  Problems  Med Hx  Surg Hx  Fam Hx      Review of Systems:  No other skin or systemic complaints except as noted in HPI or Assessment and Plan.  Objective  Well appearing patient in no apparent distress; mood and affect are within normal limits.  A full examination was performed including scalp, head, eyes, ears, nose, lips, neck, chest, axillae, abdomen, back, buttocks, bilateral upper extremities, bilateral lower extremities, hands, feet, fingers, toes, fingernails, and toenails. All findings within normal limits unless otherwise noted below.  left suprapubic 0.8 cm medium brown very thin papule   left temple 0.9 cm irregular thin brown papule  Upper Mid Back 0.2 cm dark brown thin papule within brown macule        Assessment & Plan  Nevus left suprapubic  Stable when compared to prior photo  Benign-appearing.  Observation.  Call clinic for new or changing lesions.  Recommend daily use of broad spectrum spf 30+ sunscreen to sun-exposed areas.    Neoplasm of uncertain behavior of skin left temple  Epidermal / dermal shaving  Lesion diameter (cm):  0.9 Informed consent: discussed and consent obtained   Timeout: patient name, date of birth, surgical site, and procedure verified   Anesthesia: the lesion was anesthetized in a standard fashion   Anesthetic:  1% lidocaine w/ epinephrine 1-100,000 local infiltration Instrument used: flexible razor blade    Hemostasis achieved with: aluminum chloride   Outcome: patient tolerated procedure well   Post-procedure details: wound care instructions given   Additional details:  Mupirocin and a bandage applied  Specimen 1 - Surgical pathology Differential Diagnosis: r/o Atypia  Check Margins: No 0.9 cm irregular thin brown papule  Related Medications mupirocin ointment (BACTROBAN) 2 % Apply 1 application topically daily. With dressing changes  Neoplasm of skin Upper Mid Back  Epidermal / dermal shaving  Lesion diameter (cm):  0.2 Informed consent: discussed and consent obtained   Timeout: patient name, date of birth, surgical site, and procedure verified   Anesthesia: the lesion was anesthetized in a standard fashion   Anesthetic:  1% lidocaine w/ epinephrine 1-100,000 local infiltration Instrument used: DermaBlade   Hemostasis achieved with: aluminum chloride   Outcome: patient tolerated procedure well   Post-procedure details: wound care instructions given   Additional details:  Mupirocin and a bandage applied  Specimen 2 - Surgical pathology Differential Diagnosis: r/o Atypia  Check Margins: No 0.2 cm dark brown thin papule within brown macule  History of Basal Cell Carcinoma of the Skin - No evidence of recurrence today - Recommend regular full body skin exams - Recommend daily broad spectrum sunscreen SPF 30+ to sun-exposed areas, reapply every 2 hours as needed.  - Call if any new or changing lesions are noted between office visits  History of Dysplastic Nevi - No evidence of recurrence today - Recommend regular full body skin exams - Recommend daily broad spectrum sunscreen SPF 30+  to sun-exposed areas, reapply every 2 hours as needed.  - Call if any new or changing lesions are noted between office visits  Lentigines - Scattered tan macules - Due to sun exposure - Benign-appearing, observe - Recommend daily broad spectrum sunscreen SPF 30+ to sun-exposed areas,  reapply every 2 hours as needed. - Call for any changes  Seborrheic Keratoses - Stuck-on, waxy, tan-brown papules and/or plaques  - Benign-appearing - Discussed benign etiology and prognosis. - Observe - Call for any changes  Melanocytic Nevi - Tan-brown and/or pink-flesh-colored symmetric macules and papules - Benign appearing on exam today - Observation - Call clinic for new or changing moles - Recommend daily use of broad spectrum spf 30+ sunscreen to sun-exposed areas.   Hemangiomas - Red papules - Discussed benign nature - Observe - Call for any changes  Actinic Damage - Chronic condition, secondary to cumulative UV/sun exposure - diffuse scaly erythematous macules with underlying dyspigmentation - Recommend daily broad spectrum sunscreen SPF 30+ to sun-exposed areas, reapply every 2 hours as needed.  - Staying in the shade or wearing long sleeves, sun glasses (UVA+UVB protection) and wide brim hats (4-inch brim around the entire circumference of the hat) are also recommended for sun protection.  - Call for new or changing lesions.  Skin cancer screening performed today.  Return in about 1 year (around 10/09/2022) for TBSE.  Graciella Belton, RMA, am acting as scribe for Forest Gleason, MD .  Documentation: I have reviewed the above documentation for accuracy and completeness, and I agree with the above.  Forest Gleason, MD

## 2021-10-08 NOTE — Patient Instructions (Signed)
Wound Care Instructions  Cleanse wound gently with soap and water once a day then pat dry with clean gauze. Apply a thin coat of Petrolatum (petroleum jelly, "Vaseline") over the wound (unless you have an allergy to this). We recommend that you use a new, sterile tube of Vaseline. Do not pick or remove scabs. Do not remove the yellow or white "healing tissue" from the base of the wound.  Cover the wound with fresh, clean, nonstick gauze and secure with paper tape. You may use Band-Aids in place of gauze and tape if the wound is small enough, but would recommend trimming much of the tape off as there is often too much. Sometimes Band-Aids can irritate the skin.  You should call the office for your biopsy report after 1 week if you have not already been contacted.  If you experience any problems, such as abnormal amounts of bleeding, swelling, significant bruising, significant pain, or evidence of infection, please call the office immediately.  FOR ADULT SURGERY PATIENTS: If you need something for pain relief you may take 1 extra strength Tylenol (acetaminophen) AND 2 Ibuprofen (200mg each) together every 4 hours as needed for pain. (do not take these if you are allergic to them or if you have a reason you should not take them.) Typically, you may only need pain medication for 1 to 3 days.   Melanoma ABCDEs  Melanoma is the most dangerous type of skin cancer, and is the leading cause of death from skin disease.  You are more likely to develop melanoma if you: Have light-colored skin, light-colored eyes, or red or blond hair Spend a lot of time in the sun Tan regularly, either outdoors or in a tanning bed Have had blistering sunburns, especially during childhood Have a close family member who has had a melanoma Have atypical moles or large birthmarks  Early detection of melanoma is key since treatment is typically straightforward and cure rates are extremely high if we catch it early.   The  first sign of melanoma is often a change in a mole or a new dark spot.  The ABCDE system is a way of remembering the signs of melanoma.  A for asymmetry:  The two halves do not match. B for border:  The edges of the growth are irregular. C for color:  A mixture of colors are present instead of an even brown color. D for diameter:  Melanomas are usually (but not always) greater than 6mm - the size of a pencil eraser. E for evolution:  The spot keeps changing in size, shape, and color.  Please check your skin once per month between visits. You can use a small mirror in front and a large mirror behind you to keep an eye on the back side or your body.   If you see any new or changing lesions before your next follow-up, please call to schedule a visit.  Please continue daily skin protection including broad spectrum sunscreen SPF 30+ to sun-exposed areas, reapplying every 2 hours as needed when you're outdoors.    Due to recent changes in healthcare laws, you may see results of your pathology and/or laboratory studies on MyChart before the doctors have had a chance to review them. We understand that in some cases there may be results that are confusing or concerning to you. Please understand that not all results are received at the same time and often the doctors may need to interpret multiple results in order to provide you   with the best plan of care or course of treatment. Therefore, we ask that you please give us 2 business days to thoroughly review all your results before contacting the office for clarification. Should we see a critical lab result, you will be contacted sooner.   If You Need Anything After Your Visit  If you have any questions or concerns for your doctor, please call our main line at [phone] and press option 4 to reach your doctor's medical assistant. If no one answers, please leave a voicemail as directed and we will return your call as soon as possible. Messages left after 4  pm will be answered the following business day.   You may also send us a message via MyChart. We typically respond to MyChart messages within 1-2 business days.  For prescription refills, please ask your pharmacy to contact our office. Our fax number is [phone].  If you have an urgent issue when the clinic is closed that cannot wait until the next business day, you can page your doctor at the number below.    Please note that while we do our best to be available for urgent issues outside of office hours, we are not available 24/7.   If you have an urgent issue and are unable to reach us, you may choose to seek medical care at your doctor's office, retail clinic, urgent care center, or emergency room.  If you have a medical emergency, please immediately call 911 or go to the emergency department.  Pager Numbers  - Dr. Kowalski: [phone]  - Dr. Moye: [phone]  - Dr. Stewart: [phone]  In the event of inclement weather, please call our main line at [phone] for an update on the status of any delays or closures.  Dermatology Medication Tips: Please keep the boxes that topical medications come in in order to help keep track of the instructions about where and how to use these. Pharmacies typically print the medication instructions only on the boxes and not directly on the medication tubes.   If your medication is too expensive, please contact our office at [phone] option 4 or send us a message through MyChart.   We are unable to tell what your co-pay for medications will be in advance as this is different depending on your insurance coverage. However, we may be able to find a substitute medication at lower cost or fill out paperwork to get insurance to cover a needed medication.   If a prior authorization is required to get your medication covered by your insurance company, please allow us 1-2 business days to complete this process.  Drug prices often vary  depending on where the prescription is filled and some pharmacies may offer cheaper prices.  The website www.goodrx.com contains coupons for medications through different pharmacies. The prices here do not account for what the cost may be with help from insurance (it may be cheaper with your insurance), but the website can give you the price if you did not use any insurance.  - You can print the associated coupon and take it with your prescription to the pharmacy.  - You may also stop by our office during regular business hours and pick up a GoodRx coupon card.  - If you need your prescription sent electronically to a different pharmacy, notify our office through [place] MyChart or by phone at [phone] option 4.     Si Usted Necesita Algo Despus de Su Visita  Tambin puede enviarnos un mensaje a travs   de MyChart. Por lo general respondemos a los mensajes de MyChart en el transcurso de 1 a 2 das hbiles.  Para renovar recetas, por favor pida a su farmacia que se ponga en contacto con nuestra oficina. Nuestro nmero de fax es el [phone].  Si tiene un asunto urgente cuando la clnica est cerrada y que no puede esperar hasta el siguiente da hbil, puede llamar/localizar a su doctor(a) al nmero que aparece a continuacin.   Por favor, tenga en cuenta que aunque hacemos todo lo posible para estar disponibles para asuntos urgentes fuera del horario de oficina, no estamos disponibles las 24 horas del da, los 7 das de la semana.   Si tiene un problema urgente y no puede comunicarse con nosotros, puede optar por buscar atencin mdica  en el consultorio de su doctor(a), en una clnica privada, en un centro de atencin urgente o en una sala de emergencias.  Si tiene una emergencia mdica, por favor llame inmediatamente al 911 o vaya a la sala de emergencias.  Nmeros de bper  - Dr. Kowalski: [phone]  - Dra. Moye: [phone]  - Dra. Stewart: [phone]  En caso de  inclemencias del tiempo, por favor llame a nuestra lnea principal al [phone] para una actualizacin sobre el estado de cualquier retraso o cierre.  Consejos para la medicacin en dermatologa: Por favor, guarde las cajas en las que vienen los medicamentos de uso tpico para ayudarle a seguir las instrucciones sobre dnde y cmo usarlos. Las farmacias generalmente imprimen las instrucciones del medicamento slo en las cajas y no directamente en los tubos del medicamento.   Si su medicamento es muy caro, por favor, pngase en contacto con nuestra oficina llamando al [phone] y presione la opcin 4 o envenos un mensaje a travs de MyChart.   No podemos decirle cul ser su copago por los medicamentos por adelantado ya que esto es diferente dependiendo de la cobertura de su seguro. Sin embargo, es posible que podamos encontrar un medicamento sustituto a menor costo o llenar un formulario para que el seguro cubra el medicamento que se considera necesario.   Si se requiere una autorizacin previa para que su compaa de seguros cubra su medicamento, por favor permtanos de 1 a 2 das hbiles para completar este proceso.  Los precios de los medicamentos varan con frecuencia dependiendo del lugar de dnde se surte la receta y alguna farmacias pueden ofrecer precios ms baratos.  El sitio web www.goodrx.com tiene cupones para medicamentos de diferentes farmacias. Los precios aqu no tienen en cuenta lo que podra costar con la ayuda del seguro (puede ser ms barato con su seguro), pero el sitio web puede darle el precio si no utiliz ningn seguro.  - Puede imprimir el cupn correspondiente y llevarlo con su receta a la farmacia.  - Tambin puede pasar por nuestra oficina durante el horario de atencin regular y recoger una tarjeta de cupones de GoodRx.  - Si necesita que su receta se enve electrnicamente a una farmacia diferente, informe a nuestra oficina a travs de MyChart de [place] o  por telfono llamando al [phone] y presione la opcin 4.  

## 2021-10-12 ENCOUNTER — Telehealth: Payer: Self-pay

## 2021-10-12 NOTE — Telephone Encounter (Signed)
Discussed pathology results. Patient voiced understanding. JP

## 2021-10-12 NOTE — Telephone Encounter (Signed)
-----   Message from Alfonso Patten, MD sent at 10/11/2021  8:30 PM EDT ----- 1. Skin , left temple SOLAR LENTIGO "sun spot"  no treatment needed  2. Skin , upper mid back MELANOCYTIC NEVUS, INTRADERMAL TYPE This is a NORMAL MOLE. No additional treatment is needed. If you notice any new or changing spots or have other skin concerns in future, please call our office at [phone].      MAs please call. Thank you!

## 2021-10-16 ENCOUNTER — Encounter: Payer: Self-pay | Admitting: Dermatology

## 2022-08-16 ENCOUNTER — Other Ambulatory Visit: Payer: Self-pay

## 2022-08-16 ENCOUNTER — Inpatient Hospital Stay
Admission: EM | Admit: 2022-08-16 | Discharge: 2022-08-20 | DRG: 177 | Disposition: A | Discharge status: Home Health | Payer: Medicare Other | Attending: Internal Medicine | Admitting: Internal Medicine

## 2022-08-16 ENCOUNTER — Emergency Department: Payer: Medicare Other

## 2022-08-16 DIAGNOSIS — E1122 Type 2 diabetes mellitus with diabetic chronic kidney disease: Secondary | ICD-10-CM | POA: Diagnosis present

## 2022-08-16 DIAGNOSIS — J9601 Acute respiratory failure with hypoxia: Secondary | ICD-10-CM | POA: Diagnosis present

## 2022-08-16 DIAGNOSIS — I10 Essential (primary) hypertension: Secondary | ICD-10-CM | POA: Diagnosis not present

## 2022-08-16 DIAGNOSIS — R0902 Hypoxemia: Secondary | ICD-10-CM | POA: Diagnosis not present

## 2022-08-16 DIAGNOSIS — J4489 Other specified chronic obstructive pulmonary disease: Secondary | ICD-10-CM | POA: Insufficient documentation

## 2022-08-16 DIAGNOSIS — Z87891 Personal history of nicotine dependence: Secondary | ICD-10-CM

## 2022-08-16 DIAGNOSIS — Z85828 Personal history of other malignant neoplasm of skin: Secondary | ICD-10-CM | POA: Diagnosis not present

## 2022-08-16 DIAGNOSIS — I5022 Chronic systolic (congestive) heart failure: Secondary | ICD-10-CM | POA: Diagnosis present

## 2022-08-16 DIAGNOSIS — Z8619 Personal history of other infectious and parasitic diseases: Secondary | ICD-10-CM | POA: Diagnosis not present

## 2022-08-16 DIAGNOSIS — U071 COVID-19: Secondary | ICD-10-CM | POA: Diagnosis not present

## 2022-08-16 DIAGNOSIS — I110 Hypertensive heart disease with heart failure: Secondary | ICD-10-CM | POA: Diagnosis present

## 2022-08-16 DIAGNOSIS — E872 Acidosis, unspecified: Secondary | ICD-10-CM | POA: Diagnosis present

## 2022-08-16 DIAGNOSIS — E7800 Pure hypercholesterolemia, unspecified: Secondary | ICD-10-CM | POA: Diagnosis present

## 2022-08-16 DIAGNOSIS — J1282 Pneumonia due to coronavirus disease 2019: Secondary | ICD-10-CM | POA: Diagnosis present

## 2022-08-16 DIAGNOSIS — J189 Pneumonia, unspecified organism: Secondary | ICD-10-CM | POA: Diagnosis not present

## 2022-08-16 DIAGNOSIS — E860 Dehydration: Secondary | ICD-10-CM | POA: Diagnosis present

## 2022-08-16 DIAGNOSIS — J440 Chronic obstructive pulmonary disease with acute lower respiratory infection: Secondary | ICD-10-CM | POA: Diagnosis present

## 2022-08-16 DIAGNOSIS — E785 Hyperlipidemia, unspecified: Secondary | ICD-10-CM | POA: Diagnosis not present

## 2022-08-16 DIAGNOSIS — K219 Gastro-esophageal reflux disease without esophagitis: Secondary | ICD-10-CM | POA: Diagnosis present

## 2022-08-16 DIAGNOSIS — Z79899 Other long term (current) drug therapy: Secondary | ICD-10-CM | POA: Diagnosis not present

## 2022-08-16 LAB — CBC
HCT: 50.3 % (ref 39.0–52.0)
Hemoglobin: 17.5 g/dL — ABNORMAL HIGH (ref 13.0–17.0)
MCH: 31.4 pg (ref 26.0–34.0)
MCHC: 34.8 g/dL (ref 30.0–36.0)
MCV: 90.3 fL (ref 80.0–100.0)
Platelets: 121 10*3/uL — ABNORMAL LOW (ref 150–400)
RBC: 5.57 MIL/uL (ref 4.22–5.81)
RDW: 13 % (ref 11.5–15.5)
WBC: 9.9 10*3/uL (ref 4.0–10.5)
nRBC: 0 % (ref 0.0–0.2)

## 2022-08-16 LAB — BASIC METABOLIC PANEL
Anion gap: 13 (ref 5–15)
BUN: 15 mg/dL (ref 8–23)
CO2: 17 mmol/L — ABNORMAL LOW (ref 22–32)
Calcium: 8.5 mg/dL — ABNORMAL LOW (ref 8.9–10.3)
Chloride: 101 mmol/L (ref 98–111)
Creatinine, Ser: 1.27 mg/dL — ABNORMAL HIGH (ref 0.61–1.24)
GFR, Estimated: 58 mL/min — ABNORMAL LOW (ref >60)
Glucose, Bld: 132 mg/dL — ABNORMAL HIGH (ref 70–99)
Potassium: 4.5 mmol/L (ref 3.5–5.1)
Sodium: 131 mmol/L — ABNORMAL LOW (ref 135–145)

## 2022-08-16 LAB — SARS CORONAVIRUS 2 BY RT PCR: SARS Coronavirus 2 by RT PCR: POSITIVE — AB

## 2022-08-16 LAB — TROPONIN I (HIGH SENSITIVITY)
Troponin I (High Sensitivity): 25 ng/L — ABNORMAL HIGH (ref <18)
Troponin I (High Sensitivity): 28 ng/L — ABNORMAL HIGH (ref <18)

## 2022-08-16 LAB — BRAIN NATRIURETIC PEPTIDE: B Natriuretic Peptide: 56.9 pg/mL (ref 0.0–100.0)

## 2022-08-16 MED ORDER — SODIUM CHLORIDE 0.9 % IV BOLUS
1000.0000 mL | Freq: Once | INTRAVENOUS | Status: AC
  Administered 2022-08-16: 1000 mL via INTRAVENOUS

## 2022-08-16 MED ORDER — AMLODIPINE BESYLATE 10 MG PO TABS
10.0000 mg | ORAL_TABLET | Freq: Every day | ORAL | Status: DC
  Administered 2022-08-16 – 2022-08-20 (×5): 10 mg via ORAL
  Filled 2022-08-16: qty 2
  Filled 2022-08-16 (×4): qty 1

## 2022-08-16 MED ORDER — SODIUM CHLORIDE 0.9 % IV SOLN: INTRAVENOUS | Status: DC

## 2022-08-16 MED ORDER — TIOTROPIUM BROMIDE MONOHYDRATE 18 MCG IN CAPS
18.0000 ug | ORAL_CAPSULE | Freq: Every day | RESPIRATORY_TRACT | Status: DC
  Administered 2022-08-17 – 2022-08-20 (×4): 18 ug via RESPIRATORY_TRACT
  Filled 2022-08-16 (×2): qty 5

## 2022-08-16 MED ORDER — ALBUTEROL SULFATE HFA 108 (90 BASE) MCG/ACT IN AERS
2.0000 | INHALATION_SPRAY | RESPIRATORY_TRACT | Status: DC | PRN
  Administered 2022-08-17: 2 via RESPIRATORY_TRACT
  Filled 2022-08-16: qty 6.7

## 2022-08-16 MED ORDER — FAMOTIDINE 20 MG PO TABS
20.0000 mg | ORAL_TABLET | Freq: Two times a day (BID) | ORAL | Status: DC
  Administered 2022-08-16 – 2022-08-20 (×8): 20 mg via ORAL
  Filled 2022-08-16 (×8): qty 1

## 2022-08-16 MED ORDER — PRAVASTATIN SODIUM 20 MG PO TABS
40.0000 mg | ORAL_TABLET | Freq: Every day | ORAL | Status: DC
  Administered 2022-08-16 – 2022-08-19 (×4): 40 mg via ORAL
  Filled 2022-08-16 (×4): qty 2

## 2022-08-16 MED ORDER — NIRMATRELVIR/RITONAVIR (PAXLOVID)TABLET
3.0000 | ORAL_TABLET | Freq: Two times a day (BID) | ORAL | Status: DC
  Administered 2022-08-16 – 2022-08-18 (×4): 3 via ORAL
  Filled 2022-08-16 (×2): qty 30

## 2022-08-16 MED ORDER — POTASSIUM CHLORIDE CRYS ER 20 MEQ PO TBCR
20.0000 meq | EXTENDED_RELEASE_TABLET | Freq: Every day | ORAL | Status: DC
  Administered 2022-08-17 – 2022-08-20 (×4): 20 meq via ORAL
  Filled 2022-08-16 (×4): qty 1

## 2022-08-16 MED ORDER — METHYLPREDNISOLONE SODIUM SUCC 125 MG IJ SOLR
1.0000 mg/kg | Freq: Two times a day (BID) | INTRAMUSCULAR | Status: AC
  Administered 2022-08-16 – 2022-08-19 (×6): 72.5 mg via INTRAVENOUS
  Filled 2022-08-16 (×6): qty 2

## 2022-08-16 MED ORDER — MUPIROCIN 2 % EX OINT
1.0000 | TOPICAL_OINTMENT | Freq: Every day | CUTANEOUS | Status: DC
  Administered 2022-08-18 – 2022-08-20 (×3): 1 via TOPICAL
  Filled 2022-08-16 (×2): qty 22

## 2022-08-16 MED ORDER — IPRATROPIUM-ALBUTEROL 0.5-2.5 (3) MG/3ML IN SOLN: 3.0000 mL | RESPIRATORY_TRACT | Status: DC | PRN

## 2022-08-16 MED ORDER — GABAPENTIN 300 MG PO CAPS
300.0000 mg | ORAL_CAPSULE | Freq: Every day | ORAL | Status: DC
  Administered 2022-08-16 – 2022-08-19 (×4): 300 mg via ORAL
  Filled 2022-08-16 (×4): qty 1

## 2022-08-16 MED ORDER — LISINOPRIL 20 MG PO TABS
40.0000 mg | ORAL_TABLET | Freq: Every day | ORAL | Status: DC
  Administered 2022-08-16 – 2022-08-20 (×5): 40 mg via ORAL
  Filled 2022-08-16 (×2): qty 2
  Filled 2022-08-16: qty 4
  Filled 2022-08-16 (×2): qty 2

## 2022-08-16 MED ORDER — ONDANSETRON HCL 4 MG/2ML IJ SOLN: 4.0000 mg | Freq: Four times a day (QID) | INTRAMUSCULAR | Status: DC | PRN

## 2022-08-16 MED ORDER — SPIRONOLACTONE 25 MG PO TABS
25.0000 mg | ORAL_TABLET | Freq: Every day | ORAL | Status: DC
  Administered 2022-08-16 – 2022-08-20 (×5): 25 mg via ORAL
  Filled 2022-08-16 (×5): qty 1

## 2022-08-16 MED ORDER — ENOXAPARIN SODIUM 40 MG/0.4ML IJ SOSY
40.0000 mg | PREFILLED_SYRINGE | INTRAMUSCULAR | Status: DC
  Administered 2022-08-16 – 2022-08-19 (×4): 40 mg via SUBCUTANEOUS
  Filled 2022-08-16 (×4): qty 0.4

## 2022-08-16 MED ORDER — SODIUM CHLORIDE 0.9 % IV SOLN
2.0000 g | INTRAVENOUS | Status: DC
  Administered 2022-08-16 – 2022-08-17 (×2): 2 g via INTRAVENOUS
  Filled 2022-08-16 (×2): qty 20

## 2022-08-16 MED ORDER — ONDANSETRON HCL 4 MG PO TABS: 4.0000 mg | ORAL_TABLET | Freq: Four times a day (QID) | ORAL | Status: DC | PRN

## 2022-08-16 MED ORDER — ACETAMINOPHEN 325 MG PO TABS: 650.0000 mg | ORAL_TABLET | Freq: Four times a day (QID) | ORAL | Status: DC | PRN

## 2022-08-16 MED ORDER — TRAZODONE HCL 50 MG PO TABS
25.0000 mg | ORAL_TABLET | Freq: Every evening | ORAL | Status: DC | PRN
  Administered 2022-08-17 – 2022-08-19 (×2): 25 mg via ORAL
  Filled 2022-08-16 (×2): qty 1

## 2022-08-16 MED ORDER — METOPROLOL SUCCINATE ER 50 MG PO TB24
50.0000 mg | ORAL_TABLET | Freq: Every day | ORAL | Status: DC
  Administered 2022-08-16 – 2022-08-20 (×5): 50 mg via ORAL
  Filled 2022-08-16 (×5): qty 1

## 2022-08-16 MED ORDER — PANTOPRAZOLE SODIUM 40 MG PO TBEC
40.0000 mg | DELAYED_RELEASE_TABLET | Freq: Every day | ORAL | Status: DC
  Administered 2022-08-16 – 2022-08-20 (×5): 40 mg via ORAL
  Filled 2022-08-16 (×5): qty 1

## 2022-08-16 MED ORDER — PREDNISONE 20 MG PO TABS
50.0000 mg | ORAL_TABLET | Freq: Every day | ORAL | Status: DC
  Administered 2022-08-19 – 2022-08-20 (×2): 50 mg via ORAL
  Filled 2022-08-16 (×2): qty 1

## 2022-08-16 MED ORDER — ACETAMINOPHEN 650 MG RE SUPP: 650.0000 mg | Freq: Four times a day (QID) | RECTAL | Status: DC | PRN

## 2022-08-16 MED ORDER — MAGNESIUM HYDROXIDE 400 MG/5ML PO SUSP: 30.0000 mL | Freq: Every day | ORAL | Status: DC | PRN

## 2022-08-16 MED ORDER — GUAIFENESIN ER 600 MG PO TB12
600.0000 mg | ORAL_TABLET | Freq: Two times a day (BID) | ORAL | Status: DC
  Administered 2022-08-16 – 2022-08-20 (×8): 600 mg via ORAL
  Filled 2022-08-16 (×8): qty 1

## 2022-08-16 MED ORDER — SODIUM CHLORIDE 0.9 % IV SOLN
500.0000 mg | INTRAVENOUS | Status: DC
  Administered 2022-08-16: 500 mg via INTRAVENOUS
  Filled 2022-08-16 (×2): qty 5

## 2022-08-16 MED ORDER — ZINC SULFATE 220 (50 ZN) MG PO CAPS
220.0000 mg | ORAL_CAPSULE | Freq: Every day | ORAL | Status: DC
  Administered 2022-08-16 – 2022-08-20 (×5): 220 mg via ORAL
  Filled 2022-08-16 (×5): qty 1

## 2022-08-16 MED ORDER — ACETAMINOPHEN 325 MG PO TABS
650.0000 mg | ORAL_TABLET | Freq: Once | ORAL | Status: AC
  Administered 2022-08-16: 650 mg via ORAL
  Filled 2022-08-16: qty 2

## 2022-08-16 NOTE — Plan of Care (Signed)

## 2022-08-16 NOTE — Assessment & Plan Note (Signed)
-   We will place the patient on as needed DuoNebs and continue Spiriva.

## 2022-08-16 NOTE — Assessment & Plan Note (Signed)
-   We will monitor BMP with hydration and above management.

## 2022-08-16 NOTE — H&P (Signed)
[place]   PATIENT NAME: Glenn Welch    MR#:  [id number]  DATE OF BIRTH:  02-Jun-1945  DATE OF ADMISSION:  08/16/2022  PRIMARY CARE PHYSICIAN: Lynnea Ferrier, MD   Patient is coming from: Home  REQUESTING/REFERRING PHYSICIAN: Pilar Jarvis, MD  CHIEF COMPLAINT:   Chief Complaint  Patient presents with   COVID +    HISTORY OF PRESENT ILLNESS:  Glenn Welch is a 77 y.o. Caucasian male with medical history significant for asthma, COPD, chronic systolic CHF, stage III chronic kidney disease, GERD, hypertension, dyslipidemia and osteoarthritis, presented to the emergency room with acute onset of worsening dry cough with associated dyspnea over the last few days as well as diarrhea and significant diminished appetite since Saturday.  He admitted to chills but did not have any measured fever.  No dysuria, oliguria or hematuria or flank pain.  No recent sick exposure.  His daughter tested him for COVID and it came back positive.  She is not sure who he was exposed to.  No chest pain or palpitations.  No bleeding diathesis.  ED Course: When he came to the ER, respiratory rate was 24 and later 36 with pulse symmetry of 88% on room air otherwise normal vital signs.  Pulse oximetry was 9597% on 2 L O2 by nasal cannula.  Labs revealed sodium 131 with CO2 of 17 and glucose of 132 with creatinine 1.27 and a calcium of 8.5.  Troponin I was 25 and later 28.  COVID-19 PCR came back positive  EKG as reviewed by me :  EKG showed normal sinus rhythm with a rate of 83 with LVH and IVCD Imaging: Two-view chest x-ray showed the following: 1. Interval improvement in the prior extensive patchy airspace opacities throughout the left lung compared to 07/30/2021. Compared to more remote 01/01/2019 baseline radiographs, there is now increased left midlung opacification which may represent residual pneumonia. Consider follow-up radiographs in 4 weeks to assess for resolution. 2. No significant  change in chronic left lateral lower lung calcified pleural plaque and partially calcified right upper lung interstitial scarring compared to 01/01/2019 radiograph.  The patient was given 1 L bolus of IV normal saline and 650 mg p.o. Tylenol.  Will start him on p.o. Paxlovid after pharmacy consultation.  He will be admitted to a medical telemetry bed for further evaluation and management.  PAST MEDICAL HISTORY:   Past Medical History:  Diagnosis Date   Arthritis    Asthma    B12 deficiency    Basal cell carcinoma 01/10/2019   mid upper back   CHF (congestive heart failure) (HCC)    chronic systolic CHF   Chronic kidney disease    stage 3   COPD (chronic obstructive pulmonary disease) (HCC)    Degenerative disc disease, lumbar    Diabetes mellitus without complication (HCC)    Dysplastic nevus 02/21/2019   right mid back/excision   Dysplastic nevus 07/03/2020   left mid back, severe. Excised 08/27/2020, margins free   Dyspnea    GERD (gastroesophageal reflux disease)    History of Helicobacter pylori infection    HNP (herniated nucleus pulposus)    Hyperaldosteronism (HCC)    Hyperlipidemia    Hypertension    Hypokalemia    Lumbar radiculitis    Lung mass    PAC (premature atrial contraction)    Pure hypercholesterolemia    Seizure (HCC)    x1.  With brain tumor 1995 or 1996  Thrombocytopenia (HCC)    Wears dentures    full upper and lower    PAST SURGICAL HISTORY:   Past Surgical History:  Procedure Laterality Date   BRAIN TUMOR EXCISION     benign   CATARACT EXTRACTION W/PHACO Left 05/05/2021   Procedure: CATARACT EXTRACTION PHACO AND INTRAOCULAR LENS PLACEMENT (IOC) LEFT DIABETIC 15.07 01:15.7;  Surgeon: Galen Manila, MD;  Location: [facility];  Service: Ophthalmology;  Laterality: Left;   COLONOSCOPY WITH PROPOFOL N/A 11/19/2015   Procedure: COLONOSCOPY WITH PROPOFOL;  Surgeon: Scot Jun, MD;  Location: [facility];  Service: Endoscopy;   Laterality: N/A;   COLONOSCOPY WITH PROPOFOL N/A 12/05/2020   Procedure: COLONOSCOPY WITH PROPOFOL;  Surgeon: Regis Bill, MD;  Location: [facility] END[facility]COPY;  Service: Endoscopy;  Laterality: N/A;   ESOPHAGOGASTRODUODENOSCOPY (EGD) WITH PROPOFOL N/A 01/01/2019   Procedure: ESOPHAGOGASTRODUODENOSCOPY (EGD) WITH PROPOFOL;  Surgeon: Toledo, Boykin Nearing, MD;  Location: [facility] ENDOSCOPY;  Service: Gastroenterology;  Laterality: N/A;   THORACOTOMY     upper lobe of left lung      SOCIAL HISTORY:   Social History   Tobacco Use   Smoking status: Former    Current packs/day: 0.00    Types: Cigarettes    Quit date: 2005    Years since quitting: 19.5   Smokeless tobacco: Never  Substance Use Topics   Alcohol use: Yes    Comment: occ    FAMILY HISTORY:  History reviewed. No pertinent family history.  DRUG ALLERGIES:  No Known Allergies  REVIEW OF SYSTEMS:   ROS As per history of present illness. All pertinent systems were reviewed above. Constitutional, HEENT, cardiovascular, respiratory, GI, GU, musculoskeletal, neuro, psychiatric, endocrine, integumentary and hematologic systems were reviewed and are otherwise negative/unremarkable except for positive findings mentioned above in the HPI.   MEDICATIONS AT HOME:   Prior to Admission medications   Medication Sig Start Date End Date Taking? Authorizing Provider  albuterol (PROVENTIL HFA;VENTOLIN HFA) 108 (90 Base) MCG/ACT inhaler Inhale 2 puffs into the lungs every 6 (six) hours as needed for wheezing or shortness of breath.   Yes [provider]  amLODipine (NORVASC) 10 MG tablet Take 10 mg by mouth daily.   Yes [provider]  lovastatin (MEVACOR) 40 MG tablet Take 40 mg by mouth at bedtime.   Yes [provider]  metoprolol succinate (TOPROL-XL) 25 MG 24 hr tablet Take 50 mg by mouth daily.    Yes [provider]  omeprazole (PRILOSEC) 20 MG capsule Take 20 mg by mouth daily.   Yes [provider]  potassium chloride SA (K-DUR,KLOR-CON) 20 MEQ tablet Take 100 mEq by mouth every other day.   Yes [provider]  spironolactone (ALDACTONE) 25 MG tablet Take 25 mg by mouth in the morning.   Yes [provider]  tiotropium (SPIRIVA) 18 MCG inhalation capsule Place 18 mcg into inhaler and inhale daily.   Yes [provider]      VITAL SIGNS:  Blood pressure 136/77, pulse 82, temperature 98.1 F (36.7 C), temperature source Oral, resp. rate (!) 24, height 5\' 9"  (1.753 m), weight 72.6 kg, SpO2 100%.  PHYSICAL EXAMINATION:  Physical Exam  GENERAL:  77 y.o.-year-old Caucasian male patient lying in the bed with no acute distress.  EYES: Pupils equal, round, reactive to light and accommodation. No scleral icterus. Extraocular muscles intact.  HEENT: Head atraumatic, normocephalic. Oropharynx and nasopharynx clear.  NECK:  Supple, no jugular venous distention. No thyroid enlargement, no  tenderness.  LUNGS: Diminished left basal breath sounds with associated crackles.  No use of accessory muscles of respiration.  CARDIOVASCULAR: Regular rate and rhythm, S1, S2 normal. No murmurs, rubs, or gallops.  ABDOMEN: Soft, nondistended, nontender. Bowel sounds present. No organomegaly or mass.  EXTREMITIES: No pedal edema, cyanosis, or clubbing.  NEUROLOGIC: Cranial nerves II through XII are intact. Muscle strength 5/5 in all extremities. Sensation intact. Gait not checked.  PSYCHIATRIC: The patient is alert and oriented x 3.  Normal affect and good eye contact. SKIN: No obvious rash, lesion, or ulcer.   LABORATORY PANEL:   CBC Recent Labs  Lab 08/16/22 1309  WBC 9.9  HGB 17.5*  HCT 50.3  PLT 121*   ------------------------------------------------------------------------------------------------------------------  Chemistries  Recent Labs  Lab 08/16/22 1309  NA 131*  K 4.5  CL 101  CO2 17*  GLUCOSE 132*  BUN 15  CREATININE 1.27*  CALCIUM 8.5*    ------------------------------------------------------------------------------------------------------------------  Cardiac Enzymes No results for input(s): "TROPONINI" in the last 168 hours. ------------------------------------------------------------------------------------------------------------------  RADIOLOGY:  DG Chest 2 View  Result Date: 08/16/2022 CLINICAL DATA:  Shortness of breath. EXAM: CHEST - 2 VIEW COMPARISON:  Chest radiographs 07/30/2021 and 01/01/2019; CT chest 03/12/2020 FINDINGS: Cardiac silhouette and mediastinal contours are within limits. Surgical staples overlie the left aspect of the aortic arch. Interval improvement in the prior extensive patchy airspace opacities throughout the left lung compared to 07/30/2021. Compared to more remote 01/01/2019 baseline radiographs, there is now increased left midlung opacification which may represent residual pneumonia. No significant change in left lateral lower lung calcified pleural plaque. Partially calcified right upper lung interstitial scarring is similar to 01/01/2019 radiograph. Old healed posterolateral right eighth rib fracture. IMPRESSION: 1. Interval improvement in the prior extensive patchy airspace opacities throughout the left lung compared to 07/30/2021. Compared to more remote 01/01/2019 baseline radiographs, there is now increased left midlung opacification which may represent residual pneumonia. Consider follow-up radiographs in 4 weeks to assess for resolution. 2. No significant change in chronic left lateral lower lung calcified pleural plaque and partially calcified right upper lung interstitial scarring compared to 01/01/2019 radiograph. Electronically Signed   By: Neita Garnet M.D.   On: 08/16/2022 13:26      IMPRESSION AND PLAN:  Assessment and Plan: * Acute hypoxemic respiratory failure due to COVID-19 [facility]) -The patient will be admitted to an isolation monitored bed with droplet and contact  precautions. -The patient will be placed on scheduled Mucinex and as needed Tussionex. -O2 protocol will be followed. -We will follow CRP, ferritin, LDH and D-dimer. -Will follow manual differential for ANC/ALC ratio as well as follow troponin I and daily CBC with manual differential and CMP. - Will place the patient on p.o. Paxlovid and IV steroid therapy with IV Solu-Medrol with elevated inflammatory markers. -The patient will be placed on vitamin D3, vitamin C, zinc sulfate, p.o. Pepcid and aspirin.   Left lower lobe pneumonia - This is likely secondary to COVID-19. - We will add IV Rocephin and Zithromax for the possibility of underlying secondary bacterial infection.  Metabolic acidosis - We will monitor BMP with hydration and above management.  Asthma with COPD - We will place the patient on as needed DuoNebs and continue Spiriva.  GERD without esophagitis - We will continue PPI therapy.  Dyslipidemia - We will continue statin therapy.  Essential hypertension - We will continue his antihypertensives.   DVT prophylaxis: Lovenox.  Advanced Care Planning:  Code Status: full code.  Family Communication:  The plan of care was discussed in details with the patient (and family). I answered all questions. The patient agreed to proceed with the above mentioned plan. Further management will depend upon hospital course. Disposition Plan: Back to previous home environment Consults called: none.  All the records are reviewed and case discussed with ED provider.  Status is: Inpatient   At the time of the admission, it appears that the appropriate admission status for this patient is inpatient.  This is judged to be reasonable and necessary in order to provide the required intensity of service to ensure the patient's safety given the presenting symptoms, physical exam findings and initial radiographic and laboratory data in the context of comorbid conditions.  The patient requires  inpatient status due to high intensity of service, high risk of further deterioration and high frequency of surveillance required.  I certify that at the time of admission, it is my clinical judgment that the patient will require inpatient hospital care extending more than 2 midnights.                            Dispo: The patient is from: Home              Anticipated d/c is to: Home              Patient currently is not medically stable to d/c.              Difficult to place patient: No  Hannah Beat M.D on 08/16/2022 at 3:37 PM  [facility]   From 7 PM-7 AM, contact night-coverage [email or web address]  CC: Primary care physician; Lynnea Ferrier, MD

## 2022-08-16 NOTE — Assessment & Plan Note (Signed)
-   This is likely secondary to COVID-19. - We will add IV Rocephin and Zithromax for the possibility of underlying secondary bacterial infection.

## 2022-08-16 NOTE — Assessment & Plan Note (Signed)
-   We will continue his antihypertensives. 

## 2022-08-16 NOTE — Assessment & Plan Note (Signed)
-   We will continue statin therapy. 

## 2022-08-16 NOTE — ED Notes (Signed)
Informed floor that patient was being transferred to assigned room at this time.

## 2022-08-16 NOTE — Assessment & Plan Note (Signed)
-   We will continue PPI therapy 

## 2022-08-16 NOTE — ED Provider Notes (Signed)
[facility] Provider Note    Event Date/Time   First MD Initiated Contact with Patient 08/16/22 1241     (approximate)   History   COVID +   HPI  Glenn Welch is a 77 y.o. male   Past medical history of CHF, COPD former smoker who presents to the emergency department with positive home COVID test and symptomatic over the last 1 week worsening.  Profound exertional dyspnea.  Cough, nasal congestion, sore throat.  Myalgias and profound fatigue.  Lives alone and cannot ambulate across the room now.  No home O2.  No reported fevers.  No known sick contacts.  No GI or GU complaints.  Independent Historian contributed to assessment above: Daughter at bedside corroborates information given above     Physical Exam   Triage Vital Signs: ED Triage Vitals  Encounter Vitals Group     BP --      Systolic BP Percentile --      Diastolic BP Percentile --      Pulse Rate 08/16/22 1235 81     Resp 08/16/22 1235 (!) 24     Temp --      Temp src --      SpO2 08/16/22 1235 (!) 88 %     Weight 08/16/22 1233 160 lb (72.6 kg)     Height 08/16/22 1233 5\' 9"  (1.753 m)     Head Circumference --      Peak Flow --      Pain Score 08/16/22 1232 3     Pain Loc --      Pain Education --      Exclude from Growth Chart --     Most recent vital signs: Vitals:   08/16/22 1345 08/16/22 1348  BP: 130/78   Pulse: 85   Resp: (!) 24   Temp:  98.1 F (36.7 C)  SpO2: 97%     General: Awake, no distress.  CV:  Good peripheral perfusion.  Resp:  Normal effort.  Abd:  No distention.  Other:  No respiratory distress and clear lungs, afebrile nontoxic-appearing.  Ambulatory sats dropped to 86% on room air.  Placed on 2 L nasal cannula.  Appears dehydrated with dry mucous membranes.  No peripheral edema. Soft non tender abd   ED Results / Procedures / Treatments   Labs (all labs ordered are listed, but only abnormal results are displayed) Labs Reviewed  SARS  CORONAVIRUS 2 BY RT PCR - Abnormal; Notable for the following components:      Result Value   SARS Coronavirus 2 by RT PCR POSITIVE (*)    All other components within normal limits  BASIC METABOLIC PANEL - Abnormal; Notable for the following components:   Sodium 131 (*)    CO2 17 (*)    Glucose, Bld 132 (*)    Creatinine, Ser 1.27 (*)    Calcium 8.5 (*)    GFR, Estimated 58 (*)    All other components within normal limits  CBC - Abnormal; Notable for the following components:   Hemoglobin 17.5 (*)    Platelets 121 (*)    All other components within normal limits  TROPONIN I (HIGH SENSITIVITY) - Abnormal; Notable for the following components:   Troponin I (High Sensitivity) 25 (*)    All other components within normal limits  BRAIN NATRIURETIC PEPTIDE  TROPONIN I (HIGH SENSITIVITY)     I ordered and reviewed the above labs they are notable for  Positive COVID, initial troponin 25 consistent with baseline in the 20s last year  EKG  ED ECG REPORT I, Pilar Jarvis, the attending physician, personally viewed and interpreted this ECG.   Date: 08/16/2022  EKG Time: 1242  Rate: 83  Rhythm: sinus  Axis: nl  Intervals:none  ST&T Change: no stemi (TWI in I and avl old from 2023)    RADIOLOGY I independently reviewed and interpreted left-sided lung opacity concerning for pneumonia I also reviewed radiologist's formal read states that these opacities compared to July 2023 are markedly improved.  PROCEDURES:  Critical Care performed: Yes, see critical care procedure note(s)  .Critical Care  Performed by: Pilar Jarvis, MD Authorized by: Pilar Jarvis, MD   Critical care provider statement:    Critical care time (minutes):  30   Critical care was time spent personally by me on the following activities:  Development of treatment plan with patient or surrogate, discussions with consultants, evaluation of patient's response to treatment, examination of patient, ordering and review of  laboratory studies, ordering and review of radiographic studies, ordering and performing treatments and interventions, pulse oximetry, re-evaluation of patient's condition and review of old charts    MEDICATIONS ORDERED IN ED: Medications  sodium chloride 0.9 % bolus 1,000 mL (has no administration in time range)  acetaminophen (TYLENOL) tablet 650 mg (has no administration in time range)    External physician / consultants:  I spoke with hospitalist for admission and regarding care plan for this patient.   IMPRESSION / MDM / ASSESSMENT AND PLAN / ED COURSE  I reviewed the triage vital signs and the nursing notes.                                Patient's presentation is most consistent with acute presentation with potential threat to life or bodily function.  Differential diagnosis includes, but is not limited to, COVID-pneumonia, hypoxemia, bacterial pneumonia, COPD exacerbation, dehydration electrolyte derangement   The patient is on the cardiac monitor to evaluate for evidence of arrhythmia and/or significant heart rate changes.  MDM:    Patient positive for COVID profoundly fatigued and short of breath with minimal exertion, desaturation on exertion, no signs of bacterial pneumonia today with patchy opacity on the left side improved from prior imaging, white blood cell count is normal, afebrile.  I think this is due to COVID.  Placed on O2 nasal cannula, given IV crystalloid bolus for dehydration, admission.         FINAL CLINICAL IMPRESSION(S) / ED DIAGNOSES   Final diagnoses:  COVID  Hypoxemia     Rx / DC Orders   ED Discharge Orders     None        Note:  This document was prepared using Dragon voice recognition software and may include unintentional dictation errors.    Pilar Jarvis, MD 08/16/22 [phone]

## 2022-08-16 NOTE — ED Triage Notes (Signed)
Pt to ED for [facility] worsening yesterday. Tested positive for COVID yesterday. Hx COPD Pt with labored breathing.

## 2022-08-16 NOTE — Assessment & Plan Note (Signed)
-  The patient will be admitted to an isolation monitored bed with droplet and contact precautions. -The patient will be placed on scheduled Mucinex and as needed Tussionex. -O2 protocol will be followed. -We will follow CRP, ferritin, LDH and D-dimer. -Will follow manual differential for ANC/ALC ratio as well as follow troponin I and daily CBC with manual differential and CMP. - Will place the patient on p.o. Paxlovid and IV steroid therapy with IV Solu-Medrol with elevated inflammatory markers. -The patient will be placed on vitamin D3, vitamin C, zinc sulfate, p.o. Pepcid and aspirin.

## 2022-08-17 DIAGNOSIS — U071 COVID-19: Secondary | ICD-10-CM | POA: Diagnosis not present

## 2022-08-17 DIAGNOSIS — J9601 Acute respiratory failure with hypoxia: Secondary | ICD-10-CM | POA: Diagnosis not present

## 2022-08-17 LAB — BASIC METABOLIC PANEL
Anion gap: 11 (ref 5–15)
BUN: 20 mg/dL (ref 8–23)
CO2: 19 mmol/L — ABNORMAL LOW (ref 22–32)
Calcium: 8 mg/dL — ABNORMAL LOW (ref 8.9–10.3)
Chloride: 106 mmol/L (ref 98–111)
Creatinine, Ser: 1.19 mg/dL (ref 0.61–1.24)
GFR, Estimated: >60 mL/min (ref >60)
Glucose, Bld: 144 mg/dL — ABNORMAL HIGH (ref 70–99)
Potassium: 4.5 mmol/L (ref 3.5–5.1)
Sodium: 136 mmol/L (ref 135–145)

## 2022-08-17 LAB — CBC
HCT: 46.6 % (ref 39.0–52.0)
Hemoglobin: 16 g/dL (ref 13.0–17.0)
MCH: 31 pg (ref 26.0–34.0)
MCHC: 34.3 g/dL (ref 30.0–36.0)
MCV: 90.3 fL (ref 80.0–100.0)
Platelets: 120 10*3/uL — ABNORMAL LOW (ref 150–400)
RBC: 5.16 MIL/uL (ref 4.22–5.81)
RDW: 13.2 % (ref 11.5–15.5)
WBC: 9.6 10*3/uL (ref 4.0–10.5)
nRBC: 0 % (ref 0.0–0.2)

## 2022-08-17 MED ORDER — AZITHROMYCIN 250 MG PO TABS
500.0000 mg | ORAL_TABLET | Freq: Every day | ORAL | Status: DC
  Administered 2022-08-17 – 2022-08-18 (×2): 500 mg via ORAL
  Filled 2022-08-17 (×2): qty 2

## 2022-08-17 MED ORDER — SODIUM CHLORIDE 0.9 % IV SOLN: INTRAVENOUS | Status: DC

## 2022-08-17 MED ORDER — ENSURE ENLIVE PO LIQD
237.0000 mL | Freq: Two times a day (BID) | ORAL | Status: DC
  Administered 2022-08-17 – 2022-08-20 (×5): 237 mL via ORAL

## 2022-08-17 MED ORDER — ALBUTEROL SULFATE HFA 108 (90 BASE) MCG/ACT IN AERS: 2.0000 | INHALATION_SPRAY | RESPIRATORY_TRACT | Status: DC | PRN

## 2022-08-17 NOTE — Progress Notes (Signed)
PHARMACIST - PHYSICIAN COMMUNICATION DR:   Margo Aye CONCERNING: Antibiotic IV to Oral Route Change Policy  RECOMMENDATION: This patient is receiving azithromycin 500 mg by the intravenous route.  Based on criteria approved by the Pharmacy and Therapeutics Committee, the antibiotic(s) is/are being converted to the equivalent oral dose form(s).   DESCRIPTION: These criteria include: Patient being treated for a respiratory tract infection, urinary tract infection, cellulitis or clostridium difficile associated diarrhea if on metronidazole The patient is not neutropenic and does not exhibit a GI malabsorption state The patient is eating (either orally or via tube) and/or has been taking other orally administered medications for a least 24 hours The patient is improving clinically and has a Tmax < 100.5  If you have questions about this conversion, please contact the Pharmacy Department  []   [phone] )  [facility] [x]   [phone] )  [facility] []   [phone] )  [facility] []   [phone] )  [facility] []   [phone] )  [facility]

## 2022-08-17 NOTE — TOC CM/SW Note (Signed)
Transition of Care [facility]) - Inpatient Brief Assessment   Patient Details  Name: Glenn Welch MRN: [id number] Date of Birth: 1945-06-10  [facility]) CM/SW Contact:    Chapman Fitch, RN Phone Number: 08/17/2022, 10:07 AM   Clinical Narrative:  Patient noted to be on RA currently.  Please consult TOC if Home o2 indicated   Transition of Care Asessment: Insurance and Status: Insurance coverage has been reviewed       Prior/Current Home Services: No current home services Social Determinants of Health Reivew: SDOH reviewed no interventions necessary Readmission risk has been reviewed: Yes Transition of care needs: no transition of care needs at this time

## 2022-08-17 NOTE — Progress Notes (Addendum)
PROGRESS NOTE  Glenn Welch ZOX:[id number] DOB: 07-06-1945 DOA: 08/16/2022 PCP: Lynnea Ferrier, MD  HPI/Recap of past 24 hours: Glenn Welch is a 77 y.o. Caucasian male with medical history significant for asthma, COPD, GERD, hypertension, dyslipidemia and osteoarthritis, presented to the emergency room with acute onset of worsening dry cough with associated dyspnea over the last few days as well as diarrhea and significant diminished appetite since Saturday.  He admitted to chills but did not have any measured fever.  No dysuria, oliguria or hematuria or flank pain.  No recent sick exposure.  His daughter tested him for COVID and it came back positive.  She is not sure who he was exposed to.  No chest pain or palpitations.  No bleeding diathesis.  COVID-19 screening test done in the ED returned positive.  The patient was started on antiviral Paxlovid.  Due to concern for superimposed bacterial pulmonary infection Rocephin and azithromycin were added.  Lab studies were also remarkable for elevated high-sensitivity troponin.  08/17/2022: The patient was seen and examined at bedside.  States his breathing is improved.  Still having diarrhea.  Assessment/Plan: Principal Problem:   Acute hypoxemic respiratory failure due to COVID-19 [facility]) Active Problems:   Left lower lobe pneumonia   Metabolic acidosis   Essential hypertension   Dyslipidemia   GERD without esophagitis   Asthma with COPD  Acute hypoxemic respiratory failure due to COVID-19 (HCC) Continue to maintain O2 saturation above 92% IV Solu-Medrol Wean off oxygen supplementation as tolerated Continue antiviral and antibacterial therapies. Bronchodilators, antitussives Incentive spirometer Mobilize as tolerated.   Diarrheal illness, viral, from COVID-19 viral infection Continue supportive care Continue gentle IV fluid hydration NS at 50 cc/h x 1 L. Encourage oral intake as tolerated to avoid dehydration   Left lower  lobe pneumonia, suspect superimposed bacterial pulmonary infection - We will add IV Rocephin and Zithromax for the possibility of underlying secondary bacterial infection.   Metabolic acidosis - We will monitor BMP with hydration and above management.   Asthma with COPD - We will place the patient on as needed DuoNebs and continue Spiriva.   GERD without esophagitis - We will continue PPI therapy.   Dyslipidemia - We will continue statin therapy.   Essential hypertension - We will continue his antihypertensives.     DVT prophylaxis: Lovenox subcu daily.  Advanced Care Planning:  Code Status: full code.  Family Communication:  The plan of care was discussed in details with the patient (and family). I answered all questions. The patient agreed to proceed with the above mentioned plan. Further management will depend upon hospital course. Disposition Plan: Back to previous home environment.  The patient requires at least 2 midnight hospital 0.  Condition. Consults called: none.    Objective: Vitals:   08/16/22 2200 08/16/22 2310 08/17/22 0528 08/17/22 0755  BP: 111/67 119/71 110/68 120/80  Pulse: 70 72 60 73  Resp: (!) 22 16 18 18   Temp:  97.9 F (36.6 C) 97.6 F (36.4 C) 98.2 F (36.8 C)  TempSrc:  Oral Oral Oral  SpO2: 94% 95% 95% 93%  Weight:  70 kg    Height:  5\' 9"  (1.753 m)      Intake/Output Summary (Last 24 hours) at 08/17/2022 1622 Last data filed at 08/17/2022 1420 Gross per 24 hour  Intake 100 ml  Output 500 ml  Net -400 ml   Filed Weights   08/16/22 1233 08/16/22 2310  Weight: 72.6 kg 70 kg  Exam:  General: 77 y.o. year-old male well developed well nourished in no acute distress.  Alert and oriented x3. Cardiovascular: Regular rate and rhythm with no rubs or gallops.  No thyromegaly or JVD noted.   Respiratory: Mild rales at bases.  Mild wheezing. Good inspiratory effort. Abdomen: Soft nontender nondistended with normal bowel sounds x4  quadrants. Musculoskeletal: No lower extremity edema. 2/4 pulses in all 4 extremities. Skin: No ulcerative lesions noted or rashes, Psychiatry: Mood is appropriate for condition and setting   Data Reviewed: CBC: Recent Labs  Lab 08/16/22 1309 08/17/22 0637  WBC 9.9 9.6  HGB 17.5* 16.0  HCT 50.3 46.6  MCV 90.3 90.3  PLT 121* 120*   Basic Metabolic Panel: Recent Labs  Lab 08/16/22 1309 08/17/22 0637  NA 131* 136  K 4.5 4.5  CL 101 106  CO2 17* 19*  GLUCOSE 132* 144*  BUN 15 20  CREATININE 1.27* 1.19  CALCIUM 8.5* 8.0*   GFR: Estimated Creatinine Clearance: 51.5 mL/min (by C-G formula based on SCr of 1.19 mg/dL). Liver Function Tests: No results for input(s): "AST", "ALT", "ALKPHOS", "BILITOT", "PROT", "ALBUMIN" in the last 168 hours. No results for input(s): "LIPASE", "AMYLASE" in the last 168 hours. No results for input(s): "AMMONIA" in the last 168 hours. Coagulation Profile: No results for input(s): "INR", "PROTIME" in the last 168 hours. Cardiac Enzymes: No results for input(s): "CKTOTAL", "CKMB", "CKMBINDEX", "TROPONINI" in the last 168 hours. BNP (last 3 results) No results for input(s): "PROBNP" in the last 8760 hours. HbA1C: No results for input(s): "HGBA1C" in the last 72 hours. CBG: No results for input(s): "GLUCAP" in the last 168 hours. Lipid Profile: No results for input(s): "CHOL", "HDL", "LDLCALC", "TRIG", "CHOLHDL", "LDLDIRECT" in the last 72 hours. Thyroid Function Tests: No results for input(s): "TSH", "T4TOTAL", "FREET4", "T3FREE", "THYROIDAB" in the last 72 hours. Anemia Panel: No results for input(s): "VITAMINB12", "FOLATE", "FERRITIN", "TIBC", "IRON", "RETICCTPCT" in the last 72 hours. Urine analysis: No results found for: "COLORURINE", "APPEARANCEUR", "LABSPEC", "PHURINE", "GLUCOSEU", "HGBUR", "BILIRUBINUR", "KETONESUR", "PROTEINUR", "UROBILINOGEN", "NITRITE", "LEUKOCYTESUR" Sepsis  Labs: @LABRCNTIP (procalcitonin:4,lacticidven:4)  ) Recent Results (from the past 240 hour(s))  SARS Coronavirus 2 by RT PCR (hospital order, performed in [facility] hospital lab) *cepheid single result test* Anterior Nasal Swab     Status: Abnormal   Collection Time: 08/16/22  1:03 PM   Specimen: Anterior Nasal Swab  Result Value Ref Range Status   SARS Coronavirus 2 by RT PCR POSITIVE (A) NEGATIVE Final    Comment: (NOTE) SARS-CoV-2 target nucleic acids are DETECTED  SARS-CoV-2 RNA is generally detectable in upper respiratory specimens  during the acute phase of infection.  Positive results are indicative  of the presence of the identified virus, but do not rule out bacterial infection or co-infection with other pathogens not detected by the test.  Clinical correlation with patient history and  other diagnostic information is necessary to determine patient infection status.  The expected result is negative.  Fact Sheet for Patients:   RoadLapTop.co.za   Fact Sheet for Healthcare Providers:   http://kim-miller.com/    This test is not yet approved or cleared by the [place] FDA and  has been authorized for detection and/or diagnosis of SARS-CoV-2 by FDA under an Emergency Use Authorization (EUA).  This EUA will remain in effect (meaning this test can be used) for the duration of  the COVID-19 declaration under Section 564(b)(1)  of the Act, 21 U.S.C. section 360-bbb-3(b)(1), unless the authorization is terminated or revoked  sooner.   Performed at [facility], [address], [place], [place] [address]       Studies: No results found.  Scheduled Meds:  amLODipine  10 mg Oral Daily   azithromycin  500 mg Oral Daily   enoxaparin (LOVENOX) injection  40 mg Subcutaneous Q24H   famotidine  20 mg Oral BID   feeding supplement  237 mL Oral BID BM   gabapentin  300 mg Oral QHS   guaiFENesin  600 mg Oral BID    lisinopril  40 mg Oral Daily   methylPREDNISolone (SOLU-MEDROL) injection  1 mg/kg Intravenous Q12H   Followed by   Melene Muller ON 08/19/2022] predniSONE  50 mg Oral Daily   metoprolol succinate  50 mg Oral Daily   mupirocin ointment  1 Application Topical Daily   nirmatrelvir/ritonavir  3 tablet Oral BID   pantoprazole  40 mg Oral Daily   potassium chloride SA  20 mEq Oral Daily   pravastatin  40 mg Oral q1800   spironolactone  25 mg Oral Daily   tiotropium  18 mcg Inhalation Daily   zinc sulfate  220 mg Oral Daily    Continuous Infusions:  sodium chloride 50 mL/hr at 08/17/22 1420   cefTRIAXone (ROCEPHIN)  IV Stopped (08/16/22 1806)     LOS: 1 day     Darlin Drop, MD [facility] Pager [phone]  If 7PM-7AM, please contact night-coverage [email or web address] Password [facility] 08/17/2022, 4:22 PM

## 2022-08-18 DIAGNOSIS — J9601 Acute respiratory failure with hypoxia: Secondary | ICD-10-CM | POA: Diagnosis not present

## 2022-08-18 DIAGNOSIS — U071 COVID-19: Secondary | ICD-10-CM | POA: Diagnosis not present

## 2022-08-18 LAB — COMPREHENSIVE METABOLIC PANEL
ALT: 21 U/L (ref 0–44)
AST: 37 U/L (ref 15–41)
Albumin: 2.5 g/dL — ABNORMAL LOW (ref 3.5–5.0)
Alkaline Phosphatase: 47 U/L (ref 38–126)
Anion gap: 7 (ref 5–15)
BUN: 26 mg/dL — ABNORMAL HIGH (ref 8–23)
CO2: 20 mmol/L — ABNORMAL LOW (ref 22–32)
Calcium: 7.4 mg/dL — ABNORMAL LOW (ref 8.9–10.3)
Chloride: 108 mmol/L (ref 98–111)
Creatinine, Ser: 1.07 mg/dL (ref 0.61–1.24)
GFR, Estimated: >60 mL/min (ref >60)
Glucose, Bld: 166 mg/dL — ABNORMAL HIGH (ref 70–99)
Potassium: 4 mmol/L (ref 3.5–5.1)
Sodium: 135 mmol/L (ref 135–145)
Total Bilirubin: 0.7 mg/dL (ref 0.3–1.2)
Total Protein: 5.6 g/dL — ABNORMAL LOW (ref 6.5–8.1)

## 2022-08-18 LAB — CBC
HCT: 41.2 % (ref 39.0–52.0)
Hemoglobin: 14.3 g/dL (ref 13.0–17.0)
MCH: 31.1 pg (ref 26.0–34.0)
MCHC: 34.7 g/dL (ref 30.0–36.0)
MCV: 89.6 fL (ref 80.0–100.0)
Platelets: 138 10*3/uL — ABNORMAL LOW (ref 150–400)
RBC: 4.6 MIL/uL (ref 4.22–5.81)
RDW: 13.2 % (ref 11.5–15.5)
WBC: 10.4 10*3/uL (ref 4.0–10.5)
nRBC: 0 % (ref 0.0–0.2)

## 2022-08-18 LAB — MAGNESIUM: Magnesium: 2.4 mg/dL (ref 1.7–2.4)

## 2022-08-18 LAB — PROCALCITONIN: Procalcitonin: 0.19 ng/mL

## 2022-08-18 LAB — PHOSPHORUS: Phosphorus: 2.5 mg/dL (ref 2.5–4.6)

## 2022-08-18 MED ORDER — FUROSEMIDE 10 MG/ML IJ SOLN
40.0000 mg | Freq: Every day | INTRAMUSCULAR | Status: DC
  Administered 2022-08-18 – 2022-08-20 (×3): 40 mg via INTRAVENOUS
  Filled 2022-08-18 (×4): qty 4

## 2022-08-18 NOTE — Plan of Care (Signed)

## 2022-08-18 NOTE — Evaluation (Signed)
Occupational Therapy Evaluation Patient Details Name: KJUAN SEIPP MRN: [id number] DOB: Feb 09, 1945 Today's Date: 08/18/2022   History of Present Illness Pt is a 77 y.o. Caucasian male with medical history significant for asthma, COPD, GERD, hypertension, dyslipidemia and osteoarthritis, presented to the emergency room with acute onset of worsening dry cough with associated dyspnea. Covid+, PNA with suspect superimposed bacterial pulmonary infection.   Clinical Impression   Patient presenting with decreased Ind in self care,balance, functional mobility/transfers, endurance, and safety awareness. Patient reports living at home alone and independent at baseline in an apartment. Patient currently functioning at supervision overall for mobility and self care needs in room. Pt remains on 3Ls via [place] with O2 saturation remaining at or above 92%. Patient will benefit from acute OT to increase overall independence in the areas of ADLs, functional mobility, and safety awareness in order to safely discharge.     Recommendations for follow up therapy are one component of a multi-disciplinary discharge planning process, led by the attending physician.  Recommendations may be updated based on patient status, additional functional criteria and insurance authorization.   Assistance Recommended at Discharge Intermittent Supervision/Assistance  Patient can return home with the following A little help with walking and/or transfers;A little help with bathing/dressing/bathroom;Assistance with cooking/housework;Assist for transportation;Help with stairs or ramp for entrance    Functional Status Assessment  Patient has had a recent decline in their functional status and demonstrates the ability to make significant improvements in function in a reasonable and predictable amount of time.  Equipment Recommendations  None recommended by OT       Precautions / Restrictions Precautions Precautions: Fall       Mobility Bed Mobility               General bed mobility comments: seated in recliner chair    Transfers Overall transfer level: Needs assistance Equipment used: None Transfers: Sit to/from Stand Sit to Stand: Supervision                  Balance Overall balance assessment: Needs assistance Sitting-balance support: Feet supported Sitting balance-Leahy Scale: Good     Standing balance support: During functional activity, No upper extremity supported Standing balance-Leahy Scale: Good                             ADL either performed or assessed with clinical judgement   ADL Overall ADL's : Needs assistance/impaired                         Toilet Transfer: Supervision/safety             General ADL Comments: supervision overall     Vision Patient Visual Report: No change from baseline              Pertinent Vitals/Pain Pain Assessment Pain Assessment: No/denies pain     Hand Dominance Right   Extremity/Trunk Assessment Upper Extremity Assessment Upper Extremity Assessment: Overall WFL for tasks assessed   Lower Extremity Assessment Lower Extremity Assessment: Overall WFL for tasks assessed       Communication Communication Communication: No difficulties   Cognition Arousal/Alertness: Awake/alert Behavior During Therapy: WFL for tasks assessed/performed Overall Cognitive Status: Within Functional Limits for tasks assessed  Home Living Family/patient expects to be discharged to:: Private residence Living Arrangements: Alone Available Help at Discharge: Family;Available PRN/intermittently Type of Home: Apartment       Home Layout: One level     Bathroom Shower/Tub: Tub/shower unit         Home Equipment: None   Additional Comments: pt plans on staying with daughter for several days after discharge who lives in single story home with  3 STE      Prior Functioning/Environment Prior Level of Function : Independent/Modified Independent                        OT Problem List: Decreased strength;Decreased activity tolerance;Decreased safety awareness;Impaired balance (sitting and/or standing);Decreased knowledge of use of DME or AE;Cardiopulmonary status limiting activity      OT Treatment/Interventions: Self-care/ADL training;Therapeutic exercise;Therapeutic activities;Energy conservation;Balance training    OT Goals(Current goals can be found in the care plan section) Acute Rehab OT Goals Patient Stated Goal: to go home OT Goal Formulation: With patient Time For Goal Achievement: 09/01/22 Potential to Achieve Goals: Fair ADL Goals Pt Will Perform Grooming: Independently;standing Pt Will Perform Lower Body Dressing: sit to/from stand;Independently Pt Will Transfer to Toilet: Independently;ambulating Pt Will Perform Toileting - Clothing Manipulation and hygiene: sit to/from stand  OT Frequency: Min 1X/week       AM-PAC OT "6 Clicks" Daily Activity     Outcome Measure Help from another person eating meals?: None Help from another person taking care of personal grooming?: None Help from another person toileting, which includes using toliet, bedpan, or urinal?: A Little Help from another person bathing (including washing, rinsing, drying)?: A Little Help from another person to put on and taking off regular upper body clothing?: None Help from another person to put on and taking off regular lower body clothing?: A Little 6 Click Score: 21   End of Session Equipment Utilized During Treatment: Oxygen (3Ls) Nurse Communication: Mobility status  Activity Tolerance: Patient tolerated treatment well Patient left: in bed;with call bell/phone within reach;with bed alarm set  OT Visit Diagnosis: Unsteadiness on feet (R26.81);Repeated falls (R29.6);Muscle weakness (generalized) (M62.81)                Time:  1454-1510 OT Time Calculation (min): 16 min Charges:  OT General Charges $OT Visit: 1 Visit OT Evaluation $OT Eval Low Complexity: 1 Low OT Treatments $Self Care/Home Management : 8-22 mins  Jackquline Denmark, MS, OTR/L , CBIS ascom [phone]  08/18/22, 3:39 PM

## 2022-08-18 NOTE — TOC Initial Note (Addendum)
Transition of Care [facility]) - Initial/Assessment Note    Patient Details  Name: Glenn Welch MRN: [id number] Date of Birth: 04/11/1945  [facility] of [facility] CM/SW Contact:    Chapman Fitch, RN Phone Number: 08/18/2022, 3:39 PM  Clinical Narrative:                  Admitted for: Covid.Marland Kitchen  Hx CHF and COPD Admitted from: Home alone.  States he can stay wit his daughter at discharge if needed PCP: Graciela Husbands Current home health/prior home health/DME: NA  Therapy recommending home health. Patient in agreement and states he does not have a preference of home health agency.  Referral made and accepted by Barbara Cower with [facility]   Patient currently requiring 3L acute o2  Expected Discharge Plan: Home w Home Health Services     Patient Goals and CMS Choice            Expected Discharge Plan and Services       Living arrangements for the past 2 months: Apartment                                      Prior Living Arrangements/Services Living arrangements for the past 2 months: Apartment Lives with:: Self                   Activities of Daily Living Home Assistive Devices/Equipment: None ADL Screening (condition at time of admission) Patient's cognitive ability adequate to safely complete daily activities?: Yes Is the patient deaf or have difficulty hearing?: No Does the patient have difficulty seeing, even when wearing glasses/contacts?: No Does the patient have difficulty concentrating, remembering, or making decisions?: No Patient able to express need for assistance with ADLs?: Yes Does the patient have difficulty dressing or bathing?: No Independently performs ADLs?: Yes (appropriate for developmental age) Does the patient have difficulty walking or climbing stairs?: No Weakness of Legs: None Weakness of Arms/Hands: None  Permission Sought/Granted                  Emotional Assessment              Admission diagnosis:   Hypoxemia [R09.02] Acute hypoxemic respiratory failure due to COVID-19 (HCC) [U07.1, J96.01] COVID [U07.1] Patient Active Problem List   Diagnosis Date Noted   Acute hypoxemic respiratory failure due to COVID-19 (HCC) 08/16/2022   Left lower lobe pneumonia 08/16/2022   Essential hypertension 08/16/2022   Metabolic acidosis 08/16/2022   Dyslipidemia 08/16/2022   GERD without esophagitis 08/16/2022   Asthma with COPD 08/16/2022   PCP:  Lynnea Ferrier, MD Pharmacy:   [facility] [address] [place] - [address] [place] [address] Phone: [phone] Fax: [phone]     Social Determinants of Health (SDOH) Social History: SDOH Screenings   Food Insecurity: No Food Insecurity (06/10/2022)   [facility], [facility]  Housing: Patient Declined (08/16/2022)  Transportation Needs: No Transportation Needs (06/10/2022)   Received from [facility], [facility]  Utilities: Not At Risk (06/10/2022)   Received from [facility], [facility]  Financial Resource Strain: Low Risk  (06/10/2022)   Received from [facility], [facility]  Tobacco Use: Medium Risk (08/16/2022)   SDOH Interventions:     Readmission Risk  Interventions     No data to display

## 2022-08-18 NOTE — Progress Notes (Signed)
PROGRESS NOTE    Glenn Welch  WNU:[id number] DOB: December 03, 1945 DOA: 08/16/2022 PCP: Lynnea Ferrier, MD    Brief Narrative:  Glenn Welch is a 77 y.o. Caucasian male with medical history significant for asthma, COPD, GERD, hypertension, dyslipidemia and osteoarthritis, presented to the emergency room with acute onset of worsening dry cough with associated dyspnea over the last few days as well as diarrhea and significant diminished appetite since Saturday.  He admitted to chills but did not have any measured fever.  No dysuria, oliguria or hematuria or flank pain.  No recent sick exposure.  His daughter tested him for COVID and it came back positive.  She is not sure who he was exposed to.  No chest pain or palpitations.  No bleeding diathesis.  COVID-19 screening test done in the ED returned positive.  The patient was started on antiviral Paxlovid.  Due to concern for superimposed bacterial pulmonary infection Rocephin and azithromycin were added.  Lab studies were also remarkable for elevated high-sensitivity troponin.   08/17/2022: The patient was seen and examined at bedside.  States his breathing is improved.  Still having diarrhea.  7/24: Remains on 3 L nasal cannula   Assessment & Plan:   Principal Problem:   Acute hypoxemic respiratory failure due to COVID-19 [facility]) Active Problems:   Left lower lobe pneumonia   Metabolic acidosis   Essential hypertension   Dyslipidemia   GERD without esophagitis   Asthma with COPD  Acute hypoxemic respiratory failure due to COVID-19 [facility]) Respiratory status tenuous but improving Plan: Continue oxygen as needed, wean as tolerated IV steroids Bronchodilators Antitussives Lasix 40 milligrams IV daily Mobilize DC Paxlovid as use of this medication inpatient setting has not been shown to improve outcomes   Diarrheal illness, viral, from COVID-19 viral infection Continue supportive care No IV fluids in the setting of COVID  infection   Left lower lobe pneumonia, suspect viral Procalcitonin 0.18.  Not indicative of bacterial coinfection.  Discontinue antibiotics.  Patient received 3 doses of Rocephin and 3 doses of azithromycin   Metabolic acidosis Monitor   Asthma with COPD No evidence of acute exacerbation.  Continue as needed nebs and Spiriva   GERD without esophagitis PPI   Dyslipidemia Statin   Essential hypertension Continue amlodipine and metoprolol   DVT prophylaxis: Lovenox Code Status: Full Family Communication: Disposition Plan: Status is: Inpatient Remains inpatient appropriate because: COVID infection.  Hypoxic respiratory failure   Level of care: Telemetry Medical  Consultants:  None  Procedures:  None  Antimicrobials:   Subjective: Seen and examined.  Sitting up in chair.  Reports shortness of breath is improving.  Still winded when exerting himself  Objective: Vitals:   08/18/22 0128 08/18/22 0600 08/18/22 0803 08/18/22 1416  BP: 102/63 115/65 113/65 (!) 110/51  Pulse: 66 68 72 73  Resp: 18 20 19 18   Temp: 98.7 F (37.1 C) 98.7 F (37.1 C) 98.1 F (36.7 C) 98 F (36.7 C)  TempSrc: Oral Oral Axillary   SpO2: 94% 95% 92% 93%  Weight:      Height:       No intake or output data in the 24 hours ending 08/18/22 1422 Filed Weights   08/16/22 1233 08/16/22 2310  Weight: 72.6 kg 70 kg    Examination:  General exam: Appears calm and comfortable  Respiratory system: Lung sounds on the left.  Bibasilar crackles.  Normal work of breathing.  3 L Cardiovascular system: S1-S2, RRR, no murmurs, no  pedal edema Gastrointestinal system: Soft, NT/ND, normal bowel sounds Central nervous system: Alert and oriented. No focal neurological deficits. Extremities: Symmetric 5 x 5 power. Skin: No rashes, lesions or ulcers Psychiatry: Judgement and insight appear normal. Mood & affect appropriate.     Data Reviewed: I have personally reviewed following labs and imaging  studies  CBC: Recent Labs  Lab 08/16/22 1309 08/17/22 0637 08/18/22 0505  WBC 9.9 9.6 10.4  HGB 17.5* 16.0 14.3  HCT 50.3 46.6 41.2  MCV 90.3 90.3 89.6  PLT 121* 120* 138*   Basic Metabolic Panel: Recent Labs  Lab 08/16/22 1309 08/17/22 0637 08/18/22 0505  NA 131* 136 135  K 4.5 4.5 4.0  CL 101 106 108  CO2 17* 19* 20*  GLUCOSE 132* 144* 166*  BUN 15 20 26*  CREATININE 1.27* 1.19 1.07  CALCIUM 8.5* 8.0* 7.4*  MG  --   --  2.4  PHOS  --   --  2.5   GFR: Estimated Creatinine Clearance: 57.2 mL/min (by C-G formula based on SCr of 1.07 mg/dL). Liver Function Tests: Recent Labs  Lab 08/18/22 0505  AST 37  ALT 21  ALKPHOS 47  BILITOT 0.7  PROT 5.6*  ALBUMIN 2.5*   No results for input(s): "LIPASE", "AMYLASE" in the last 168 hours. No results for input(s): "AMMONIA" in the last 168 hours. Coagulation Profile: No results for input(s): "INR", "PROTIME" in the last 168 hours. Cardiac Enzymes: No results for input(s): "CKTOTAL", "CKMB", "CKMBINDEX", "TROPONINI" in the last 168 hours. BNP (last 3 results) No results for input(s): "PROBNP" in the last 8760 hours. HbA1C: No results for input(s): "HGBA1C" in the last 72 hours. CBG: No results for input(s): "GLUCAP" in the last 168 hours. Lipid Profile: No results for input(s): "CHOL", "HDL", "LDLCALC", "TRIG", "CHOLHDL", "LDLDIRECT" in the last 72 hours. Thyroid Function Tests: No results for input(s): "TSH", "T4TOTAL", "FREET4", "T3FREE", "THYROIDAB" in the last 72 hours. Anemia Panel: No results for input(s): "VITAMINB12", "FOLATE", "FERRITIN", "TIBC", "IRON", "RETICCTPCT" in the last 72 hours. Sepsis Labs: Recent Labs  Lab 08/18/22 0505  PROCALCITON 0.19    Recent Results (from the past 240 hour(s))  SARS Coronavirus 2 by RT PCR (hospital order, performed in [facility] hospital lab) *cepheid single result test* Anterior Nasal Swab     Status: Abnormal   Collection Time: 08/16/22  1:03 PM   Specimen:  Anterior Nasal Swab  Result Value Ref Range Status   SARS Coronavirus 2 by RT PCR POSITIVE (A) NEGATIVE Final    Comment: (NOTE) SARS-CoV-2 target nucleic acids are DETECTED  SARS-CoV-2 RNA is generally detectable in upper respiratory specimens  during the acute phase of infection.  Positive results are indicative  of the presence of the identified virus, but do not rule out bacterial infection or co-infection with other pathogens not detected by the test.  Clinical correlation with patient history and  other diagnostic information is necessary to determine patient infection status.  The expected result is negative.  Fact Sheet for Patients:   RoadLapTop.co.za   Fact Sheet for Healthcare Providers:   http://kim-miller.com/    This test is not yet approved or cleared by the [place] FDA and  has been authorized for detection and/or diagnosis of SARS-CoV-2 by FDA under an Emergency Use Authorization (EUA).  This EUA will remain in effect (meaning this test can be used) for the duration of  the COVID-19 declaration under Section 564(b)(1)  of the Act, 21 U.S.C. section 360-bbb-3(b)(1), unless the  authorization is terminated or revoked sooner.   Performed at [facility], [address] [place], [place] [address]          Radiology Studies: No results found.      Scheduled Meds:  amLODipine  10 mg Oral Daily   enoxaparin (LOVENOX) injection  40 mg Subcutaneous Q24H   famotidine  20 mg Oral BID   feeding supplement  237 mL Oral BID BM   furosemide  40 mg Intravenous Daily   gabapentin  300 mg Oral QHS   guaiFENesin  600 mg Oral BID   lisinopril  40 mg Oral Daily   methylPREDNISolone (SOLU-MEDROL) injection  1 mg/kg Intravenous Q12H   Followed by   Melene Muller ON 08/19/2022] predniSONE  50 mg Oral Daily   metoprolol succinate  50 mg Oral Daily   mupirocin ointment  1 Application Topical Daily    nirmatrelvir/ritonavir  3 tablet Oral BID   pantoprazole  40 mg Oral Daily   potassium chloride SA  20 mEq Oral Daily   pravastatin  40 mg Oral q1800   spironolactone  25 mg Oral Daily   tiotropium  18 mcg Inhalation Daily   zinc sulfate  220 mg Oral Daily   Continuous Infusions:   LOS: 2 days     Tresa Moore, MD [facility]   If 7PM-7AM, please contact night-coverage  08/18/2022, 2:22 PM

## 2022-08-18 NOTE — Evaluation (Signed)
Physical Therapy Evaluation Patient Details Name: MCKENZIE TORUNO MRN: [id number] DOB: 10-14-1945 Today's Date: 08/18/2022  History of Present Illness  Pt is a 77 y.o. Caucasian male with medical history significant for asthma, COPD, GERD, hypertension, dyslipidemia and osteoarthritis, presented to the emergency room with acute onset of worsening dry cough with associated dyspnea. Covid+, PNA with suspect superimposed bacterial pulmonary infection.   Clinical Impression  Pt A&Ox4, denied pain, reported ongoing SOB on 3L. Pt stated at baseline he is independent and lives alone in an apartment (2nd floor with elevator) no DME. Stated he could stay with his daughter at discharge short term if needed.  He was able to perform all mobility with supervision. Attempted weaning on room air at rest, desaturated to 87% and on 3L for remainder of session. Pt noted for elevated RR with any mobility, educated on PLB with fair carryover. He ambulated ~37ft without DME, and need ~5minutes of a seated rest break to recover and spO2 to return to >90%.  Overall the patient demonstrated deficits (see "PT Problem List") that impede the patient's functional abilities, safety, and mobility and would benefit from skilled PT intervention. Recommendation is to continue skilled PT interventions to maximize function, activity tolerance, and endurance.         Assistance Recommended at Discharge Intermittent Supervision/Assistance  If plan is discharge home, recommend the following:  Can travel by private vehicle  A little help with bathing/dressing/bathroom;Assistance with cooking/housework;Assist for transportation;Help with stairs or ramp for entrance        Equipment Recommendations None recommended by PT  Recommendations for Other Services       Functional Status Assessment Patient has had a recent decline in their functional status and demonstrates the ability to make significant improvements in function in a  reasonable and predictable amount of time.     Precautions / Restrictions Precautions Precautions: Fall Restrictions Weight Bearing Restrictions: No      Mobility  Bed Mobility Overal bed mobility: Modified Independent                  Transfers Overall transfer level: Needs assistance Equipment used: None Transfers: Sit to/from Stand, Bed to chair/wheelchair/BSC Sit to Stand: Supervision   Step pivot transfers: Supervision            Ambulation/Gait Ambulation/Gait assistance: Supervision Gait Distance (Feet): 12 Feet Assistive device: None         General Gait Details: no AD, no assist, pt limited primarily by SOB, fatigue  Stairs            Wheelchair Mobility     Tilt Bed    Modified Rankin (Stroke Patients Only)       Balance Overall balance assessment: Needs assistance Sitting-balance support: Feet supported Sitting balance-Leahy Scale: Good       Standing balance-Leahy Scale: Good                               Pertinent Vitals/Pain Pain Assessment Pain Assessment: No/denies pain    Home Living Family/patient expects to be discharged to:: Private residence Living Arrangements: Alone Available Help at Discharge: Family;Available PRN/intermittently Type of Home: Apartment Home Access: Elevator       Home Layout: One level Home Equipment: None      Prior Function Prior Level of Function : Independent/Modified Independent  Hand Dominance        Extremity/Trunk Assessment   Upper Extremity Assessment Upper Extremity Assessment: Defer to OT evaluation    Lower Extremity Assessment Lower Extremity Assessment: Generalized weakness       Communication      Cognition Arousal/Alertness: Awake/alert Behavior During Therapy: WFL for tasks assessed/performed Overall Cognitive Status: Within Functional Limits for tasks assessed                                           General Comments      Exercises     Assessment/Plan    PT Assessment Patient needs continued PT services  PT Problem List Decreased strength;Cardiopulmonary status limiting activity;Decreased activity tolerance;Decreased balance;Decreased mobility       PT Treatment Interventions DME instruction;Balance training;Gait training;Stair training;Neuromuscular re-education;Functional mobility training;Patient/family education;Therapeutic activities;Therapeutic exercise    PT Goals (Current goals can be found in the Care Plan section)  Acute Rehab PT Goals Patient Stated Goal: to breathe better PT Goal Formulation: With patient Time For Goal Achievement: 09/01/22 Potential to Achieve Goals: Good    Frequency Min 1X/week     Co-evaluation               AM-PAC PT "6 Clicks" Mobility  Outcome Measure Help needed turning from your back to your side while in a flat bed without using bedrails?: None Help needed moving from lying on your back to sitting on the side of a flat bed without using bedrails?: None Help needed moving to and from a bed to a chair (including a wheelchair)?: None Help needed standing up from a chair using your arms (e.g., wheelchair or bedside chair)?: None Help needed to walk in hospital room?: None Help needed climbing 3-5 steps with a railing? : A Little 6 Click Score: 23    End of Session Equipment Utilized During Treatment: Oxygen (3L) Activity Tolerance: Patient limited by fatigue Patient left: in chair;with call bell/phone within reach Nurse Communication: Mobility status PT Visit Diagnosis: Other abnormalities of gait and mobility (R26.89)    Time: 7846-9629 PT Time Calculation (min) (ACUTE ONLY): 29 min   Charges:   PT Evaluation $PT Eval Low Complexity: 1 Low PT Treatments $Therapeutic Activity: 23-37 mins PT General Charges $$ ACUTE PT VISIT: 1 Visit        Olga Coaster PT, DPT 11:23 AM,08/18/22

## 2022-08-19 DIAGNOSIS — J9601 Acute respiratory failure with hypoxia: Secondary | ICD-10-CM | POA: Diagnosis not present

## 2022-08-19 DIAGNOSIS — U071 COVID-19: Secondary | ICD-10-CM | POA: Diagnosis not present

## 2022-08-19 NOTE — Progress Notes (Addendum)
PROGRESS NOTE    Glenn Welch  ZOX:[id number] DOB: Feb 06, 1945 DOA: 08/16/2022 PCP: Lynnea Ferrier, MD    Brief Narrative:  Glenn Welch is a 77 y.o. Caucasian male with medical history significant for asthma, COPD, GERD, hypertension, dyslipidemia and osteoarthritis, presented to the emergency room with acute onset of worsening dry cough with associated dyspnea over the last few days as well as diarrhea and significant diminished appetite since Saturday.  He admitted to chills but did not have any measured fever.  No dysuria, oliguria or hematuria or flank pain.  No recent sick exposure.  His daughter tested him for COVID and it came back positive.  She is not sure who he was exposed to.  No chest pain or palpitations.  No bleeding diathesis.  COVID-19 screening test done in the ED returned positive.  The patient was started on antiviral Paxlovid.  Due to concern for superimposed bacterial pulmonary infection Rocephin and azithromycin were added.  Lab studies were also remarkable for elevated high-sensitivity troponin.   08/17/2022: The patient was seen and examined at bedside.  States his breathing is improved.  Still having diarrhea.  7/24: Remains on 3 L nasal cannula 7/29.  Remains on 3 L nasal cannula.  Doing well overall.  Feeling better.   Assessment & Plan:   Principal Problem:   Acute hypoxemic respiratory failure due to COVID-19 [facility]) Active Problems:   Left lower lobe pneumonia   Metabolic acidosis   Essential hypertension   Dyslipidemia   GERD without esophagitis   Asthma with COPD  Acute hypoxemic respiratory failure due to COVID-19 [facility]) Respiratory status improving.  Remains on 3 L Plan: Continue oxygen as needed, wean as tolerated P.o. prednisone 50 mg daily Bronchodilators Antitussives Lasix 40 milligrams IV daily Mobilize Monitor vitals and fever curve   Diarrheal illness, viral, from COVID-19 viral infection Continue supportive care No IV fluids  in the setting of COVID infection   Left lower lobe pneumonia, suspect viral Procalcitonin 0.18.  Not indicative of bacterial coinfection.  Discontinue antibiotics.  Patient received 3 doses of Rocephin and 3 doses of azithromycin Monitor vitals and fever curve   Metabolic acidosis Monitor   Asthma with COPD No evidence of acute exacerbation.  Continue as needed nebs and Spiriva   GERD without esophagitis PPI   Dyslipidemia Statin   Essential hypertension Continue amlodipine and metoprolol   DVT prophylaxis: Lovenox Code Status: Full Family Communication:  daughter French Ana [phone] on 7/25 Disposition Plan: Status is: Inpatient Remains inpatient appropriate because: COVID infection.  Hypoxic respiratory failure   Level of care: Telemetry Medical  Consultants:  None  Procedures:  None  Antimicrobials:   Subjective: Seen and examined.  Lying in bed.  Feeling better  Objective: Vitals:   08/18/22 1416 08/18/22 2031 08/19/22 0521 08/19/22 0743  BP: (!) 110/51 122/67 111/65 125/66  Pulse: 73 80 63 79  Resp: 18 18 18 18   Temp: 98 F (36.7 C) 97.8 F (36.6 C) 99.3 F (37.4 C) 98.5 F (36.9 C)  TempSrc:  Oral Oral Oral  SpO2: 93% 92% 96% 93%  Weight:      Height:        Intake/Output Summary (Last 24 hours) at 08/19/2022 0957 Last data filed at 08/18/2022 1300 Gross per 24 hour  Intake 350 ml  Output --  Net 350 ml   Filed Weights   08/16/22 1233 08/16/22 2310  Weight: 72.6 kg 70 kg    Examination:  General exam:  NAD Respiratory system: Bibasilar crackles.  Improved air movement.  Normal work of breathing.  3 L Cardiovascular system: S1-S2, RRR, no murmurs, no pedal edema Gastrointestinal system: Soft, NT/ND, normal bowel sounds Central nervous system: Alert and oriented. No focal neurological deficits. Extremities: Symmetric 5 x 5 power. Skin: No rashes, lesions or ulcers Psychiatry: Judgement and insight appear normal. Mood & affect  appropriate.     Data Reviewed: I have personally reviewed following labs and imaging studies  CBC: Recent Labs  Lab 08/16/22 1309 08/17/22 0637 08/18/22 0505  WBC 9.9 9.6 10.4  HGB 17.5* 16.0 14.3  HCT 50.3 46.6 41.2  MCV 90.3 90.3 89.6  PLT 121* 120* 138*   Basic Metabolic Panel: Recent Labs  Lab 08/16/22 1309 08/17/22 0637 08/18/22 0505  NA 131* 136 135  K 4.5 4.5 4.0  CL 101 106 108  CO2 17* 19* 20*  GLUCOSE 132* 144* 166*  BUN 15 20 26*  CREATININE 1.27* 1.19 1.07  CALCIUM 8.5* 8.0* 7.4*  MG  --   --  2.4  PHOS  --   --  2.5   GFR: Estimated Creatinine Clearance: 57.2 mL/min (by C-G formula based on SCr of 1.07 mg/dL). Liver Function Tests: Recent Labs  Lab 08/18/22 0505  AST 37  ALT 21  ALKPHOS 47  BILITOT 0.7  PROT 5.6*  ALBUMIN 2.5*   No results for input(s): "LIPASE", "AMYLASE" in the last 168 hours. No results for input(s): "AMMONIA" in the last 168 hours. Coagulation Profile: No results for input(s): "INR", "PROTIME" in the last 168 hours. Cardiac Enzymes: No results for input(s): "CKTOTAL", "CKMB", "CKMBINDEX", "TROPONINI" in the last 168 hours. BNP (last 3 results) No results for input(s): "PROBNP" in the last 8760 hours. HbA1C: No results for input(s): "HGBA1C" in the last 72 hours. CBG: No results for input(s): "GLUCAP" in the last 168 hours. Lipid Profile: No results for input(s): "CHOL", "HDL", "LDLCALC", "TRIG", "CHOLHDL", "LDLDIRECT" in the last 72 hours. Thyroid Function Tests: No results for input(s): "TSH", "T4TOTAL", "FREET4", "T3FREE", "THYROIDAB" in the last 72 hours. Anemia Panel: No results for input(s): "VITAMINB12", "FOLATE", "FERRITIN", "TIBC", "IRON", "RETICCTPCT" in the last 72 hours. Sepsis Labs: Recent Labs  Lab 08/18/22 0505  PROCALCITON 0.19    Recent Results (from the past 240 hour(s))  SARS Coronavirus 2 by RT PCR (hospital order, performed in [facility] hospital lab) *cepheid single result test* Anterior  Nasal Swab     Status: Abnormal   Collection Time: 08/16/22  1:03 PM   Specimen: Anterior Nasal Swab  Result Value Ref Range Status   SARS Coronavirus 2 by RT PCR POSITIVE (A) NEGATIVE Final    Comment: (NOTE) SARS-CoV-2 target nucleic acids are DETECTED  SARS-CoV-2 RNA is generally detectable in upper respiratory specimens  during the acute phase of infection.  Positive results are indicative  of the presence of the identified virus, but do not rule out bacterial infection or co-infection with other pathogens not detected by the test.  Clinical correlation with patient history and  other diagnostic information is necessary to determine patient infection status.  The expected result is negative.  Fact Sheet for Patients:   RoadLapTop.co.za   Fact Sheet for Healthcare Providers:   http://kim-miller.com/    This test is not yet approved or cleared by the [place] FDA and  has been authorized for detection and/or diagnosis of SARS-CoV-2 by FDA under an Emergency Use Authorization (EUA).  This EUA will remain in effect (meaning this test  can be used) for the duration of  the COVID-19 declaration under Section 564(b)(1)  of the Act, 21 U.S.C. section 360-bbb-3(b)(1), unless the authorization is terminated or revoked sooner.   Performed at [facility], [address] [place], [place] [address]          Radiology Studies: No results found.      Scheduled Meds:  amLODipine  10 mg Oral Daily   enoxaparin (LOVENOX) injection  40 mg Subcutaneous Q24H   famotidine  20 mg Oral BID   feeding supplement  237 mL Oral BID BM   furosemide  40 mg Intravenous Daily   gabapentin  300 mg Oral QHS   guaiFENesin  600 mg Oral BID   lisinopril  40 mg Oral Daily   metoprolol succinate  50 mg Oral Daily   mupirocin ointment  1 Application Topical Daily   pantoprazole  40 mg Oral Daily   potassium chloride SA  20 mEq Oral  Daily   pravastatin  40 mg Oral q1800   predniSONE  50 mg Oral Daily   spironolactone  25 mg Oral Daily   tiotropium  18 mcg Inhalation Daily   zinc sulfate  220 mg Oral Daily   Continuous Infusions:   LOS: 3 days     Tresa Moore, MD [facility]   If 7PM-7AM, please contact night-coverage  08/19/2022, 9:57 AM

## 2022-08-19 NOTE — TOC Progression Note (Signed)
Transition of Care [facility]) - Progression Note    Patient Details  Name: Glenn Welch MRN: [id number] Date of Birth: December 25, 1945  [facility] CM/SW Contact  Chapman Fitch, RN Phone Number: 08/19/2022, 2:03 PM  Clinical Narrative:     Per MD monitoring fever curve Patient still on 2 L acute O2.  Please consult TOC if home O2 indicated   Expected Discharge Plan: Home w Home Health Services    Expected Discharge Plan and Services       Living arrangements for the past 2 months: Apartment                                       Social Determinants of Health (SDOH) Interventions SDOH Screenings   Food Insecurity: No Food Insecurity (06/10/2022)   Received from [facility], [facility]  Housing: Patient Declined (08/16/2022)  Transportation Needs: No Transportation Needs (06/10/2022)   Received from [facility], [facility]  Utilities: Not At Risk (06/10/2022)   Received from [facility], [facility]  Financial Resource Strain: Low Risk  (06/10/2022)   Received from [facility], [facility]  Tobacco Use: Medium Risk (08/16/2022)    Readmission Risk Interventions     No data to display

## 2022-08-20 ENCOUNTER — Inpatient Hospital Stay: Payer: Medicare Other

## 2022-08-20 DIAGNOSIS — U071 COVID-19: Secondary | ICD-10-CM | POA: Diagnosis not present

## 2022-08-20 DIAGNOSIS — J9601 Acute respiratory failure with hypoxia: Secondary | ICD-10-CM | POA: Diagnosis not present

## 2022-08-20 LAB — VITAMIN C: Vitamin C: 0.7 mg/dL (ref 0.4–2.0)

## 2022-08-20 MED ORDER — PREDNISONE 50 MG PO TABS: 50.0000 mg | ORAL_TABLET | Freq: Every day | ORAL | 0 refills | Status: AC

## 2022-08-20 MED ORDER — POTASSIUM CHLORIDE CRYS ER 20 MEQ PO TBCR: 20.0000 meq | EXTENDED_RELEASE_TABLET | Freq: Every day | ORAL | 0 refills | Status: AC

## 2022-08-20 MED ORDER — FUROSEMIDE 20 MG PO TABS: 20.0000 mg | ORAL_TABLET | Freq: Every day | ORAL | 0 refills | Status: AC

## 2022-08-20 NOTE — TOC Transition Note (Signed)
Transition of Care [facility]) - CM/SW Discharge Note   Patient Details  Name: Glenn Welch MRN: [id number] Date of Birth: May 22, 1945  [facility]) CM/SW Contact:  Liliana Cline, LCSW Phone Number: 08/20/2022, 1:36 PM   Clinical Narrative:    Patient to DC home. Barbara Cower with [facility] is aware.  O2 has been delivered to bedside by [employer] Representative Jon.  Spoke to daughter who states they don't want a RW ordered, they will borrow a RW and shower seat from a friend.  Daughter aware of [facility] start of care 7/29 and [employer] o2 ordered.    Final next level of care: Home w Home Health Services Barriers to Discharge: Barriers Resolved   Patient Goals and CMS Choice CMS Medicare.gov Compare Post Acute Care list provided to:: Patient Represenative (must comment) Choice offered to / list presented to : Adult Children  Discharge Placement                    Name of family member notified: French Ana - daughter Patient and family notified of of transfer: 08/20/22  Discharge Plan and Services Additional resources added to the After Visit Summary for                  DME Arranged: Oxygen DME Agency: [facility] Date DME Agency Contacted: 08/20/22   Representative spoke with at DME Agency: Denna Haggard [facility] Arranged: PT, RN [facility] Agency: [facility] Date [facility] Agency Contacted: 08/20/22   Representative spoke with at [facility] Agency: Barbara Cower  Social Determinants of Health (SDOH) Interventions SDOH Screenings   Food Insecurity: No Food Insecurity (06/10/2022)   Received from [facility], [facility]  Housing: Patient Declined (08/16/2022)  Transportation Needs: No Transportation Needs (06/10/2022)   Received from [facility], [facility]  Utilities: Not At Risk (06/10/2022)   Received from [facility], [facility]  Financial Resource Strain: Low Risk   (06/10/2022)   Received from [facility], [facility]  Tobacco Use: Medium Risk (08/16/2022)     Readmission Risk Interventions     No data to display

## 2022-08-20 NOTE — Progress Notes (Signed)
Occupational Therapy Treatment Patient Details Name: Glenn Welch MRN: [id number] DOB: [birth year] Today's Date: 08/20/2022   History of present illness Pt is a 77 y.o. Caucasian male with medical history significant for asthma, COPD, GERD, hypertension, dyslipidemia and osteoarthritis, presented to the emergency room with acute onset of worsening dry cough with associated dyspnea. Covid+, PNA with suspect superimposed bacterial pulmonary infection.   OT comments  Upon entering the room, pt seated in recliner chair and eating breakfast. Pt is fully dresses and reports recently utilizing bathroom. Pt has a desire to attempt ambulation outside of room this session. Pt placed on RA with O2 saturation at 86% while standing. He dons mask himself to exit the room safely. He was placed on 2Ls for ambulation and walks with min guard progressing to close supervision for 100' without use of AD. Pt returning to sit in recliner chair at end of session with call bell and all needed items within reach. Pt wanting to finish eating his toast. Pt continues to benefit from OT intervention.     Recommendations for follow up therapy are one component of a multi-disciplinary discharge planning process, led by the attending physician.  Recommendations may be updated based on patient status, additional functional criteria and insurance authorization.    Assistance Recommended at Discharge Intermittent Supervision/Assistance  Patient can return home with the following  A little help with walking and/or transfers;A little help with bathing/dressing/bathroom;Assistance with cooking/housework;Assist for transportation;Help with stairs or ramp for entrance   Equipment Recommendations  None recommended by OT       Precautions / Restrictions Precautions Precautions: Fall       Mobility Bed Mobility               General bed mobility comments: seated in recliner chair    Transfers Overall transfer level:  Needs assistance Equipment used: None Transfers: Sit to/from Stand Sit to Stand: Supervision                 Balance Overall balance assessment: Needs assistance Sitting-balance support: Feet supported Sitting balance-Leahy Scale: Normal     Standing balance support: During functional activity, No upper extremity supported Standing balance-Leahy Scale: Good                             ADL either performed or assessed with clinical judgement   ADL Overall ADL's : Needs assistance/impaired                                       General ADL Comments: supervision without use of AD    Extremity/Trunk Assessment Upper Extremity Assessment Upper Extremity Assessment: Overall WFL for tasks assessed   Lower Extremity Assessment Lower Extremity Assessment: Overall WFL for tasks assessed        Vision Patient Visual Report: No change from baseline            Cognition Arousal/Alertness: Awake/alert Behavior During Therapy: WFL for tasks assessed/performed Overall Cognitive Status: Within Functional Limits for tasks assessed                                                     Pertinent Vitals/ Pain  Pain Assessment Pain Assessment: No/denies pain         Frequency  Min 1X/week        Progress Toward Goals  OT Goals(current goals can now be found in the care plan section)  Progress towards OT goals: Progressing toward goals  Acute Rehab OT Goals OT Goal Formulation: With patient Time For Goal Achievement: 09/01/22 Potential to Achieve Goals: Fair  Plan Discharge plan remains appropriate;Frequency remains appropriate       AM-PAC OT "6 Clicks" Daily Activity     Outcome Measure   Help from another person eating meals?: None Help from another person taking care of personal grooming?: None Help from another person toileting, which includes using toliet, bedpan, or urinal?: A Little Help from  another person bathing (including washing, rinsing, drying)?: A Little Help from another person to put on and taking off regular upper body clothing?: None Help from another person to put on and taking off regular lower body clothing?: A Little 6 Click Score: 21    End of Session Equipment Utilized During Treatment: Oxygen (2Ls)  OT Visit Diagnosis: Unsteadiness on feet (R26.81);Repeated falls (R29.6);Muscle weakness (generalized) (M62.81)   Activity Tolerance Patient tolerated treatment well   Patient Left with call bell/phone within reach;in chair   Nurse Communication Mobility status        Time: 6578-4696 OT Time Calculation (min): 17 min  Charges: OT General Charges $OT Visit: 1 Visit OT Treatments $Therapeutic Activity: 8-22 mins  Jackquline Denmark, MS, OTR/L , CBIS ascom [phone]  08/20/22, 11:00 AM

## 2022-08-20 NOTE — Care Management Important Message (Signed)
Important Message  Patient Details  Name: Glenn Welch MRN: [id number] Date of Birth: [birth year]   Medicare Important Message Given:  Yes  Reviewed Medicare IM with patient via room phone [phone]).  Also reviewed with Tommie Ard, daughter, at [phone] upon patient's request.  Copy of Medicare IM sent securely to patient/daughter's attention via MyChart.    Johnell Comings 08/20/2022, 10:19 AM

## 2022-08-20 NOTE — Progress Notes (Signed)
   SaO2 on room air at rest = 88% SaO2 on room air while ambulating =86% SaO2 on 2 liters of O2 while ambulating = 93%

## 2022-08-20 NOTE — TOC Progression Note (Addendum)
Transition of Care [facility]) - Progression Note    Patient Details  Name: Glenn Welch MRN: [id number] Date of Birth: Nov 24, 1945  Transition of Care [facility]) CM/SW Contact  Liliana Cline, LCSW Phone Number: 08/20/2022, 11:18 AM  Clinical Narrative:    Home o2 referral made to [facility] with [facility]. Informed him of likely DC today.  Notified Barbara Cower with [facility] of possible DC today. Home Health start of care will be 7/29, MD notified.    Expected Discharge Plan: Home w Home Health Services    Expected Discharge Plan and Services       Living arrangements for the past 2 months: Apartment                                       Social Determinants of Health (SDOH) Interventions SDOH Screenings   Food Insecurity: No Food Insecurity (06/10/2022)   Received from [facility], [facility]  Housing: Patient Declined (08/16/2022)  Transportation Needs: No Transportation Needs (06/10/2022)   Received from [facility], [facility]  Utilities: Not At Risk (06/10/2022)   Received from [facility], [facility]  Financial Resource Strain: Low Risk  (06/10/2022)   Received from [facility], [facility]  Tobacco Use: Medium Risk (08/16/2022)    Readmission Risk Interventions     No data to display

## 2022-08-20 NOTE — Progress Notes (Signed)
Physical Therapy Treatment Patient Details Name: Glenn Welch MRN: [id number] DOB: [birth year] Today's Date: 08/20/2022   History of Present Illness Pt is a 77 y.o. Caucasian male with medical history significant for asthma, COPD, GERD, hypertension, dyslipidemia and osteoarthritis, presented to the emergency room with acute onset of worsening dry cough with associated dyspnea. Covid+, PNA with suspect superimposed bacterial pulmonary infection.    PT Comments  Pt alert, agreeable to PT, seated in recliner at start/end of session. Pt with reported improvement in SOB. On 2L throughout session. Pt educated on lines management, as well as RW due to pt's continued reported leg weakness as well as an energy conservation technique. Pt agreeable to using RW. All mobility performed with supervision, no LOB noted still limited by activity tolerance. Pt up in chair at end of session, needs in reach. The patient would benefit from further skilled PT intervention to continue to progress towards goals.       Assistance Recommended at Discharge Intermittent Supervision/Assistance  If plan is discharge home, recommend the following:  Can travel by private vehicle    A little help with bathing/dressing/bathroom;Assistance with cooking/housework;Assist for transportation;Help with stairs or ramp for entrance      Equipment Recommendations  Rolling walker (2 wheels)    Recommendations for Other Services       Precautions / Restrictions Precautions Precautions: Fall Restrictions Weight Bearing Restrictions: No     Mobility  Bed Mobility               General bed mobility comments: seated in recliner chair    Transfers Overall transfer level: Needs assistance Equipment used: Rolling walker (2 wheels), None Transfers: Sit to/from Stand Sit to Stand: Supervision           General transfer comment: able to perform without RW, but improved pt comfort with RW due to pt reported leg  weakness    Ambulation/Gait Ambulation/Gait assistance: Supervision Gait Distance (Feet): 30 Feet Assistive device: Rolling walker (2 wheels), None         General Gait Details: with and without RW, pt more comfortable with RW. limited by SOB/fatigue but greatly improved from eval   Stairs             Wheelchair Mobility     Tilt Bed    Modified Rankin (Stroke Patients Only)       Balance Overall balance assessment: Needs assistance Sitting-balance support: Feet supported Sitting balance-Leahy Scale: Normal       Standing balance-Leahy Scale: Good                              Cognition Arousal/Alertness: Awake/alert Behavior During Therapy: WFL for tasks assessed/performed Overall Cognitive Status: Within Functional Limits for tasks assessed                                          Exercises      General Comments        Pertinent Vitals/Pain Pain Assessment Pain Assessment: No/denies pain    Home Living                          Prior Function            PT Goals (current goals can now be found in the care plan  section) Progress towards PT goals: Progressing toward goals    Frequency    Min 1X/week      PT Plan Current plan remains appropriate;Equipment recommendations need to be updated    Co-evaluation              AM-PAC PT "6 Clicks" Mobility   Outcome Measure  Help needed turning from your back to your side while in a flat bed without using bedrails?: None Help needed moving from lying on your back to sitting on the side of a flat bed without using bedrails?: None Help needed moving to and from a bed to a chair (including a wheelchair)?: None Help needed standing up from a chair using your arms (e.g., wheelchair or bedside chair)?: None Help needed to walk in hospital room?: None Help needed climbing 3-5 steps with a railing? : A Little 6 Click Score: 23    End of Session  Equipment Utilized During Treatment: Oxygen Activity Tolerance: Patient tolerated treatment well Patient left: in chair;with call bell/phone within reach Nurse Communication: Mobility status PT Visit Diagnosis: Other abnormalities of gait and mobility (R26.89)     Time: 1040-1059 PT Time Calculation (min) (ACUTE ONLY): 19 min  Charges:    $Therapeutic Exercise: 8-22 mins PT General Charges $$ ACUTE PT VISIT: 1 Visit                     Olga Coaster PT, DPT 1:17 PM,08/20/22

## 2022-08-20 NOTE — Discharge Summary (Signed)
Physician Discharge Summary  [name] BMW:[id number] DOB: 03/05/1945 DOA: 08/16/2022  PCP: Lynnea Ferrier, MD  Admit date: 08/16/2022 Discharge date: 08/20/2022  Admitted From: Home Disposition:  Home w home health  Recommendations for Outpatient Follow-up:  Follow up with PCP in 1-2 weeks   Home Health:Yes PT RN Equipment/Devices:Yes oxygen 2l via [place]   Discharge Condition:Stable  CODE STATUS:FULL  Diet recommendation: Heart healthy  Brief/Interim Summary:  Glenn Welch is a 77 y.o. Caucasian male with medical history significant for asthma, COPD, GERD, hypertension, dyslipidemia and osteoarthritis, presented to the emergency room with acute onset of worsening dry cough with associated dyspnea over the last few days as well as diarrhea and significant diminished appetite since Saturday.  He admitted to chills but did not have any measured fever.  No dysuria, oliguria or hematuria or flank pain.  No recent sick exposure.  His daughter tested him for COVID and it came back positive.  She is not sure who he was exposed to.  No chest pain or palpitations.  No bleeding diathesis.  COVID-19 screening test done in the ED returned positive.  The patient was started on antiviral Paxlovid.  Due to concern for superimposed bacterial pulmonary infection Rocephin and azithromycin were added.  Lab studies were also remarkable for elevated high-sensitivity troponin.   08/17/2022: The patient was seen and examined at bedside.  States his breathing is improved.  Still having diarrhea.   7/24: Remains on 3 L nasal cannula 7/25.  Remains on 3 L nasal cannula.  Doing well overall.  Feeling better. 7/26: Weaned to 2L.  Clinically improved.  Appropriate for dc      Discharge Diagnoses:  Principal Problem:   Acute hypoxemic respiratory failure due to COVID-19 [facility]) Active Problems:   Left lower lobe pneumonia   Metabolic acidosis   Essential hypertension   Dyslipidemia   GERD without  esophagitis   Asthma with COPD  Acute hypoxemic respiratory failure due to COVID-19 [facility]) Respiratory status improving.  Now on 2L Plan: Patient stable for discharge home at this time.  Home oxygen ordered.  2 L continuous.  P.o. prednisone 50 mg daily times additional 5 days.  Lasix 20 mg p.o. daily times additional 7 days.  Continue bronchodilators.  As needed antitussives.  Stable for discharge home at this time.  Home health PT and RN ordered.  Follow-up outpatient PCP 1 to 2 weeks.    Diarrheal illness, viral, from COVID-19 viral infection Improved/resolved at time of discharge   Left lower lobe pneumonia, suspect viral Procalcitonin 0.18.  Not indicative of bacterial coinfection.  Received 3 doses of Rocephin and 3 doses of azithromycin.  No further antibiotics.  Afebrile at time of discharge.  Discharge Instructions  Discharge Instructions     Diet - low sodium heart healthy   Complete by: As directed    Increase activity slowly   Complete by: As directed       Allergies as of 08/20/2022   No Known Allergies      Medication List     TAKE these medications    albuterol 108 (90 Base) MCG/ACT inhaler Commonly known as: VENTOLIN HFA Inhale 2 puffs into the lungs every 6 (six) hours as needed for wheezing or shortness of breath.   amLODipine 10 MG tablet Commonly known as: NORVASC Take 10 mg by mouth daily.   aspirin EC 81 MG tablet Take 81 mg by mouth daily.   furosemide 20 MG tablet Commonly known  as: Lasix Take 1 tablet (20 mg total) by mouth daily for 7 days.   lovastatin 40 MG tablet Commonly known as: MEVACOR Take 40 mg by mouth at bedtime.   metoprolol succinate 25 MG 24 hr tablet Commonly known as: TOPROL-XL Take 50 mg by mouth daily.   omeprazole 20 MG capsule Commonly known as: PRILOSEC Take 20 mg by mouth daily.   potassium chloride SA 20 MEQ tablet Commonly known as: KLOR-CON M Take 1 tablet (20 mEq total) by mouth daily. Start taking on:  August 21, 2022 What changed:  how much to take when to take this additional instructions   predniSONE 50 MG tablet Commonly known as: DELTASONE Take 1 tablet (50 mg total) by mouth daily for 5 days. Start taking on: August 21, 2022   spironolactone 25 MG tablet Commonly known as: ALDACTONE Take 25 mg by mouth in the morning.   tiotropium 18 MCG inhalation capsule Commonly known as: SPIRIVA Place 18 mcg into inhaler and inhale daily.               Durable Medical Equipment  (From admission, onward)           Start     Ordered   08/20/22 0935  For home use only DME oxygen  Once       Question Answer Comment  Length of Need Lifetime   Mode or (Route) Nasal cannula   Liters per Minute 2   Frequency Continuous (stationary and portable oxygen unit needed)   Oxygen conserving device Yes   Oxygen delivery system Gas      08/20/22 0934            No Known Allergies  Consultations: None   Procedures/Studies: DG Chest 2 View  Result Date: 08/16/2022 CLINICAL DATA:  Shortness of breath. EXAM: CHEST - 2 VIEW COMPARISON:  Chest radiographs 07/30/2021 and 01/01/2019; CT chest 03/12/2020 FINDINGS: Cardiac silhouette and mediastinal contours are within limits. Surgical staples overlie the left aspect of the aortic arch. Interval improvement in the prior extensive patchy airspace opacities throughout the left lung compared to 07/30/2021. Compared to more remote 01/01/2019 baseline radiographs, there is now increased left midlung opacification which may represent residual pneumonia. No significant change in left lateral lower lung calcified pleural plaque. Partially calcified right upper lung interstitial scarring is similar to 01/01/2019 radiograph. Old healed posterolateral right eighth rib fracture. IMPRESSION: 1. Interval improvement in the prior extensive patchy airspace opacities throughout the left lung compared to 07/30/2021. Compared to more remote 01/01/2019 baseline  radiographs, there is now increased left midlung opacification which may represent residual pneumonia. Consider follow-up radiographs in 4 weeks to assess for resolution. 2. No significant change in chronic left lateral lower lung calcified pleural plaque and partially calcified right upper lung interstitial scarring compared to 01/01/2019 radiograph. Electronically Signed   By: Neita Garnet M.D.   On: 08/16/2022 13:26      Subjective: Seen and examined on the day of discharge.  Stable no distress.  Remains fatigued but otherwise stable.  Discharge Exam: Vitals:   08/20/22 0447 08/20/22 0748  BP: 107/65 116/70  Pulse: 70 66  Resp: 18 (!) 22  Temp: 99.1 F (37.3 C) 98.2 F (36.8 C)  SpO2: 94% 92%   Vitals:   08/19/22 1804 08/19/22 2039 08/20/22 0447 08/20/22 0748  BP:  112/64 107/65 116/70  Pulse:  64 70 66  Resp:  18 18 (!) 22  Temp:  97.9 F (36.6 C) 99.1  F (37.3 C) 98.2 F (36.8 C)  TempSrc:  Oral Oral Oral  SpO2: 91% 92% 94% 92%  Weight:      Height:        General: Pt is alert, awake, not in acute distress Cardiovascular: RRR, S1/S2 +, no rubs, no gallops Respiratory: CTA bilaterally, no wheezing, no rhonchi Abdominal: Soft, NT, ND, bowel sounds + Extremities: no edema, no cyanosis    The results of significant diagnostics from this hospitalization (including imaging, microbiology, ancillary and laboratory) are listed below for reference.     Microbiology: Recent Results (from the past 240 hour(s))  SARS Coronavirus 2 by RT PCR (hospital order, performed in [facility] hospital lab) *cepheid single result test* Anterior Nasal Swab     Status: Abnormal   Collection Time: 08/16/22  1:03 PM   Specimen: Anterior Nasal Swab  Result Value Ref Range Status   SARS Coronavirus 2 by RT PCR POSITIVE (A) NEGATIVE Final    Comment: (NOTE) SARS-CoV-2 target nucleic acids are DETECTED  SARS-CoV-2 RNA is generally detectable in upper respiratory specimens  during the  acute phase of infection.  Positive results are indicative  of the presence of the identified virus, but do not rule out bacterial infection or co-infection with other pathogens not detected by the test.  Clinical correlation with patient history and  other diagnostic information is necessary to determine patient infection status.  The expected result is negative.  Fact Sheet for Patients:   RoadLapTop.co.za   Fact Sheet for Healthcare Providers:   http://kim-miller.com/    This test is not yet approved or cleared by the [place] FDA and  has been authorized for detection and/or diagnosis of SARS-CoV-2 by FDA under an Emergency Use Authorization (EUA).  This EUA will remain in effect (meaning this test can be used) for the duration of  the COVID-19 declaration under Section 564(b)(1)  of the Act, 21 U.S.C. section 360-bbb-3(b)(1), unless the authorization is terminated or revoked sooner.   Performed at [facility], [address] [place], [place] [address]      Labs: BNP (last 3 results) Recent Labs    08/16/22 1303  BNP 56.9   Basic Metabolic Panel: Recent Labs  Lab 08/16/22 1309 08/17/22 0637 08/18/22 0505  NA 131* 136 135  K 4.5 4.5 4.0  CL 101 106 108  CO2 17* 19* 20*  GLUCOSE 132* 144* 166*  BUN 15 20 26*  CREATININE 1.27* 1.19 1.07  CALCIUM 8.5* 8.0* 7.4*  MG  --   --  2.4  PHOS  --   --  2.5   Liver Function Tests: Recent Labs  Lab 08/18/22 0505  AST 37  ALT 21  ALKPHOS 47  BILITOT 0.7  PROT 5.6*  ALBUMIN 2.5*   No results for input(s): "LIPASE", "AMYLASE" in the last 168 hours. No results for input(s): "AMMONIA" in the last 168 hours. CBC: Recent Labs  Lab 08/16/22 1309 08/17/22 0637 08/18/22 0505  WBC 9.9 9.6 10.4  HGB 17.5* 16.0 14.3  HCT 50.3 46.6 41.2  MCV 90.3 90.3 89.6  PLT 121* 120* 138*   Cardiac Enzymes: No results for input(s): "CKTOTAL", "CKMB", "CKMBINDEX",  "TROPONINI" in the last 168 hours. BNP: Invalid input(s): "POCBNP" CBG: No results for input(s): "GLUCAP" in the last 168 hours. D-Dimer No results for input(s): "DDIMER" in the last 72 hours. Hgb A1c No results for input(s): "HGBA1C" in the last 72 hours. Lipid Profile No results for input(s): "CHOL", "HDL", "LDLCALC", "  TRIG", "CHOLHDL", "LDLDIRECT" in the last 72 hours. Thyroid function studies No results for input(s): "TSH", "T4TOTAL", "T3FREE", "THYROIDAB" in the last 72 hours.  Invalid input(s): "FREET3" Anemia work up No results for input(s): "VITAMINB12", "FOLATE", "FERRITIN", "TIBC", "IRON", "RETICCTPCT" in the last 72 hours. Urinalysis No results found for: "COLORURINE", "APPEARANCEUR", "LABSPEC", "PHURINE", "GLUCOSEU", "HGBUR", "BILIRUBINUR", "KETONESUR", "PROTEINUR", "UROBILINOGEN", "NITRITE", "LEUKOCYTESUR" Sepsis Labs Recent Labs  Lab 08/16/22 1309 08/17/22 0637 08/18/22 0505  WBC 9.9 9.6 10.4   Microbiology Recent Results (from the past 240 hour(s))  SARS Coronavirus 2 by RT PCR (hospital order, performed in [facility] hospital lab) *cepheid single result test* Anterior Nasal Swab     Status: Abnormal   Collection Time: 08/16/22  1:03 PM   Specimen: Anterior Nasal Swab  Result Value Ref Range Status   SARS Coronavirus 2 by RT PCR POSITIVE (A) NEGATIVE Final    Comment: (NOTE) SARS-CoV-2 target nucleic acids are DETECTED  SARS-CoV-2 RNA is generally detectable in upper respiratory specimens  during the acute phase of infection.  Positive results are indicative  of the presence of the identified virus, but do not rule out bacterial infection or co-infection with other pathogens not detected by the test.  Clinical correlation with patient history and  other diagnostic information is necessary to determine patient infection status.  The expected result is negative.  Fact Sheet for Patients:   RoadLapTop.co.za   Fact Sheet for  Healthcare Providers:   http://kim-miller.com/    This test is not yet approved or cleared by the [place] FDA and  has been authorized for detection and/or diagnosis of SARS-CoV-2 by FDA under an Emergency Use Authorization (EUA).  This EUA will remain in effect (meaning this test can be used) for the duration of  the COVID-19 declaration under Section 564(b)(1)  of the Act, 21 U.S.C. section 360-bbb-3(b)(1), unless the authorization is terminated or revoked sooner.   Performed at [facility], [address], [place], [place] [address]      Time coordinating discharge: Over 30 minutes  SIGNED:   Tresa Moore, MD  [facility] 08/20/2022, 1:25 PM Pager   If 7PM-7AM, please contact night-coverage

## 2022-10-13 ENCOUNTER — Encounter: Payer: Medicare Other | Admitting: Dermatology

## 2022-10-21 ENCOUNTER — Encounter: Payer: Medicare Other | Admitting: Dermatology
